# Patient Record
Sex: Female | Born: 1995 | Race: White | Hispanic: No | Marital: Single | Smoking: Current every day smoker
Health system: Southern US, Community
[De-identification: ages and names within clinical notes are randomized; demographics above are authoritative.]

## PROBLEM LIST (undated history)

## (undated) DIAGNOSIS — R6250 Unspecified lack of expected normal physiological development in childhood: Secondary | ICD-10-CM

## (undated) DIAGNOSIS — F941 Reactive attachment disorder of childhood: Secondary | ICD-10-CM

## (undated) DIAGNOSIS — F988 Other specified behavioral and emotional disorders with onset usually occurring in childhood and adolescence: Secondary | ICD-10-CM

## (undated) DIAGNOSIS — E063 Autoimmune thyroiditis: Secondary | ICD-10-CM

## (undated) DIAGNOSIS — F32A Depression, unspecified: Secondary | ICD-10-CM

## (undated) DIAGNOSIS — F913 Oppositional defiant disorder: Secondary | ICD-10-CM

## (undated) DIAGNOSIS — E669 Obesity, unspecified: Secondary | ICD-10-CM

## (undated) DIAGNOSIS — R51 Headache: Secondary | ICD-10-CM

## (undated) DIAGNOSIS — F4310 Post-traumatic stress disorder, unspecified: Secondary | ICD-10-CM

## (undated) DIAGNOSIS — E3431 Constitutional short stature: Secondary | ICD-10-CM

## (undated) DIAGNOSIS — F419 Anxiety disorder, unspecified: Secondary | ICD-10-CM

## (undated) DIAGNOSIS — E049 Nontoxic goiter, unspecified: Secondary | ICD-10-CM

## (undated) DIAGNOSIS — T7840XA Allergy, unspecified, initial encounter: Secondary | ICD-10-CM

## (undated) DIAGNOSIS — F329 Major depressive disorder, single episode, unspecified: Secondary | ICD-10-CM

## (undated) HISTORY — DX: Constitutional short stature: E34.31

## (undated) HISTORY — PX: TONSILLECTOMY: SUR1361

## (undated) HISTORY — DX: Obesity, unspecified: E66.9

## (undated) HISTORY — DX: Unspecified lack of expected normal physiological development in childhood: R62.50

## (undated) HISTORY — PX: TYMPANOSTOMY TUBE PLACEMENT: SHX32

## (undated) HISTORY — DX: Autoimmune thyroiditis: E06.3

## (undated) HISTORY — DX: Nontoxic goiter, unspecified: E04.9

## (undated) HISTORY — DX: Other specified behavioral and emotional disorders with onset usually occurring in childhood and adolescence: F98.8

---
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
## 2014-02-15 ENCOUNTER — Ambulatory Visit: Payer: Self-pay | Admitting: Pediatric Endocrinology

## 2014-07-06 ENCOUNTER — Ambulatory Visit (INDEPENDENT_AMBULATORY_CARE_PROVIDER_SITE_OTHER): Payer: BC Managed Care – PPO | Admitting: Family Medicine

## 2014-07-06 VITALS — BP 144/90 | HR 127 | Temp 98.1°F | Resp 18 | Ht 64.5 in | Wt 191.0 lb

## 2014-07-06 DIAGNOSIS — T23232A Burn of second degree of multiple left fingers (nail), not including thumb, initial encounter: Secondary | ICD-10-CM

## 2014-07-06 DIAGNOSIS — M79641 Pain in right hand: Secondary | ICD-10-CM

## 2014-07-06 DIAGNOSIS — M79642 Pain in left hand: Secondary | ICD-10-CM

## 2014-07-06 DIAGNOSIS — T310 Burns involving less than 10% of body surface: Secondary | ICD-10-CM

## 2014-07-06 MED ORDER — IBUPROFEN 200 MG PO TABS
200.0000 mg | ORAL_TABLET | Freq: Once | ORAL | Status: AC
  Administered 2014-07-06: 400 mg via ORAL

## 2014-07-06 MED ORDER — SILVER SULFADIAZINE 1 % EX CREA: 1.0000 "application " | TOPICAL_CREAM | Freq: Every day | CUTANEOUS | Status: DC

## 2014-07-06 NOTE — Patient Instructions (Signed)
Please follow up with [place] in the clinic Monday afternoon 07/11/14 so we can monitor your burns. Feel free to come back to the clinic earlier if needed. For pain you can take ibuprofen 200-400mg  every 6 hours as needed, and tylenol up to 1000mg  every 4-6 hours for pain as needed.  If you feel you need a different medication for your pain, please call the clinic.  Wash daily with soap and water.  Dressing changes daily with silvadene cream.    Second-Degree Burn A second-degree burn affects the 2 outer layers of skin. The outer layer (epidermis) and the layer underneath it (dermis) are both burned. Another name for this type of burn is a partial thickness burn. A second-degree burn may be called minor or major. This depends on the size of the burn. It also depends on what parts of the skin are burned. Minor burns may be treated with first aid. Major burns are a medical emergency. A second-degree burn is worse than a first-degree burn, but not as bad as a third-degree burn. A first-degree burn affects only the epidermis. A third-degree burn goes through all the layers of skin. A second-degree burn usually heals in 3 to 4 weeks. A minor second-degree burn usually does not leave a scar.Deeper second-degree burns may lead to scarring of the skin or contractures over joints.Contractures are scars that form over joints and may lead to reduced mobility at those joints. CAUSES  Heat (thermal) injury. This happens when skin comes in contact with something very hot. It could be a flame, a hot object, hot liquid, or steam. Most second-degree burns are thermal injuries.  Radiation. Sunlight is one type of radiation that can burn the skin. Another type of radiation is used to heat food. Radiation is also used to treat some diseases, such as cancer. All types of radiation can burn the skin. Sunlight usually causes a first-degree burn. Radiation used for heating food or treating a disease can cause a second-degree  burn.  Electricity. Electrical burns can cause more damage under the skin than on the surface. They should always be treated as major burns.  Chemicals. Many chemicals can burn the skin. The burn should be flushed with cool water and checked by an emergency caregiver. SYMPTOMS Symptoms of second-degree burns include:  Severe pain.  Extreme tenderness.  Deep redness.  Blistered skin.  Skin that has changed color.It might look blotchy, wet, or shiny.  Swelling. TREATMENT Some second-degree burns may need to be treated in a hospital. These include major burns, electrical burns, and chemical burns. Many other second-degree burns can be treated with regular first aid, such as:  Cooling the burn. Use cool, germ-free (sterile) salt water. Place the burned area of skin into a tub of water, or cover the burned area with clean, wet towels.  Taking pain medicine.  Removing the dead skin from broken blisters. A trained caregiver may do this. Do not pop blisters.  Gently washing your skin with mild soap.  Covering the burned area with a cream.Silver sulfadiazine is a cream for burns. An antibiotic cream, such as bacitracin, may also be used to fight infection. Do not use other ointments or creams unless your caregiver says it is okay.  Protecting the burn with a sterile, non-sticky bandage.  Bandaging fingers and toes separately. This keeps them from sticking together.  Taking an antibiotic. This can help prevent infection.  Getting a tetanus shot. HOME CARE INSTRUCTIONS Medication  Take any medicine prescribed by your  caregiver. Follow the directions carefully.  Ask your caregiver if you can take over-the-counter medicine to relieve pain and swelling. Do not give aspirin to children.  Make sure your caregiver knows about all other medicines you take.This includes over-the-counter medicines. Burn care  You will need to change the bandage on your burn. You may need to do this 2  or 3 times each day.  Gently clean the burned area.  Put ointment on it.  Cover the burn with a sterile bandage.  For some deeper burns or burns that cover a large area, compression garments may be prescribed. These garments can help minimize scarring and protect your mobility.  Do not put butter or oil on your skin. Use only the cream prescribed by your caregiver.  Do not put ice on your burn.  Do not break blisters on your skin.  Keep the bandaged area dry. You might need to take a sponge bath for awhile.Ask your caregiver when you can take a shower or a tub bath again.  Do not scratch an itchy burn. Your caregiver may give you medicine to relieve very bad itching.  Infection is a big danger after a second-degree burn. Tell your caregiver right away if you have signs of infection, such as:  Redness or changing color in the burned area.  Fluid leaking from the burn.  Swelling in the burn area.  A bad smell coming from the wound. Follow-up  Keep all follow-up appointments.This is important. This is how your caregiver can tell if your treatment is working.  Protect your burn from sunlight.Use sunscreen whenever you go outside.Burned areas may be sensitive to the sun for up to 1 year. Exposure to the sun may also cause permanent darkening of scars. SEEK MEDICAL CARE IF:  You have any questions about medicines.  You have any questions about your treatment.  You wonder if it is okay to do a particular activity.  You develop a fever of more than 100.5 F (38.1 C). SEEK IMMEDIATE MEDICAL CARE IF:  You think your burn might be infected. It may change color, become red, leak fluid, swell, or smell bad.  You develop a fever of more than 102 F (38.9 C). Document Released: 02/18/2011 Document Revised: 12/09/2011 Document Reviewed: 02/18/2011 [facility] Patient Information 2015 [place], [place]. This information is not intended to replace advice given to you by your health  care provider. Make sure you discuss any questions you have with your health care provider.
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
## 2014-07-08 NOTE — Progress Notes (Signed)
Patient discussed, interviewed, examined with Mr. Rosanne Sack. Agree with assessment and plan of care per his note.

## 2014-07-11 ENCOUNTER — Ambulatory Visit (INDEPENDENT_AMBULATORY_CARE_PROVIDER_SITE_OTHER): Payer: BC Managed Care – PPO | Admitting: Family Medicine

## 2014-07-11 VITALS — BP 118/72 | HR 84 | Temp 99.4°F | Resp 16 | Ht 64.5 in | Wt 191.0 lb

## 2014-07-11 DIAGNOSIS — T3110 Burns involving 10-19% of body surface with 0% to 9% third degree burns: Secondary | ICD-10-CM

## 2014-07-11 DIAGNOSIS — R05 Cough: Secondary | ICD-10-CM

## 2014-07-11 DIAGNOSIS — T3111 Burns involving 10-19% of body surface with 10-19% third degree burns: Secondary | ICD-10-CM

## 2014-07-11 DIAGNOSIS — R059 Cough, unspecified: Secondary | ICD-10-CM

## 2014-07-11 DIAGNOSIS — J069 Acute upper respiratory infection, unspecified: Secondary | ICD-10-CM

## 2014-07-11 MED ORDER — BENZONATATE 100 MG PO CAPS: 200.0000 mg | ORAL_CAPSULE | Freq: Two times a day (BID) | ORAL | Status: DC | PRN

## 2014-07-11 MED ORDER — AMOXICILLIN 875 MG PO TABS: 875.0000 mg | ORAL_TABLET | Freq: Two times a day (BID) | ORAL | Status: DC
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
## 2014-07-11 NOTE — Patient Instructions (Signed)
Cough, Adult ° A cough is a reflex that helps clear your throat and airways. It can help heal the body or may be a reaction to an irritated airway. A cough may only last 2 or 3 weeks (acute) or may last more than 8 weeks (chronic).  °CAUSES °Acute cough: °· Viral or bacterial infections. °Chronic cough: °· Infections. °· Allergies. °· Asthma. °· Post-nasal drip. °· Smoking. °· Heartburn or acid reflux. °· Some medicines. °· Chronic lung problems (COPD). °· Cancer. °SYMPTOMS  °· Cough. °· Fever. °· Chest pain. °· Increased breathing rate. °· High-pitched whistling sound when breathing (wheezing). °· Colored mucus that you cough up (sputum). °TREATMENT  °· A bacterial cough may be treated with antibiotic medicine. °· A viral cough must run its course and will not respond to antibiotics. °· Your caregiver may recommend other treatments if you have a chronic cough. °HOME CARE INSTRUCTIONS  °· Only take over-the-counter or prescription medicines for pain, discomfort, or fever as directed by your caregiver. Use cough suppressants only as directed by your caregiver. °· Use a cold steam vaporizer or humidifier in your bedroom or home to help loosen secretions. °· Sleep in a semi-upright position if your cough is worse at night. °· Rest as needed. °· Stop smoking if you smoke. °SEEK IMMEDIATE MEDICAL CARE IF:  °· You have pus in your sputum. °· Your cough starts to worsen. °· You cannot control your cough with suppressants and are losing sleep. °· You begin coughing up blood. °· You have difficulty breathing. °· You develop pain which is getting worse or is uncontrolled with medicine. °· You have a fever. °MAKE SURE YOU:  °· Understand these instructions. °· Will watch your condition. °· Will get help right away if you are not doing well or get worse. °Document Released: 03/15/2011 Document Revised: 12/09/2011 Document Reviewed: 03/15/2011 °ExitCare® Patient Information ©2015 [employer]. This information is not intended  to replace advice given to you by your health care provider. Make sure you discuss any questions you have with your health care provider. ° °Upper Respiratory Infection, Adult °An upper respiratory infection (URI) is also known as the common cold. It is often caused by a type of germ (virus). Colds are easily spread (contagious). You can pass it to others by kissing, coughing, sneezing, or drinking out of the same glass. Usually, you get better in 1 or 2 weeks.  °HOME CARE  °· Only take medicine as told by your doctor. °· Use a warm mist humidifier or breathe in steam from a hot shower. °· Drink enough water and fluids to keep your pee (urine) clear or pale yellow. °· Get plenty of rest. °· Return to work when your temperature is back to normal or as told by your doctor. You may use a face mask and wash your hands to stop your cold from spreading. °GET HELP RIGHT AWAY IF:  °· After the first few days, you feel you are getting worse. °· You have questions about your medicine. °· You have chills, shortness of breath, or brown or red spit (mucus). °· You have yellow or brown snot (nasal discharge) or pain in the face, especially when you bend forward. °· You have a fever, puffy (swollen) neck, pain when you swallow, or white spots in the back of your throat. °· You have a bad headache, ear pain, sinus pain, or chest pain. °· You have a high-pitched whistling sound when you breathe in and out (wheezing). °· You   have a lasting cough or cough up blood. °· You have sore muscles or a stiff neck. °MAKE SURE YOU:  °· Understand these instructions. °· Will watch your condition. °· Will get help right away if you are not doing well or get worse. °Document Released: 03/04/2008 Document Revised: 12/09/2011 Document Reviewed: 12/22/2013 °ExitCare® Patient Information ©2015 [employer]. This information is not intended to replace advice given to you by your health care provider. Make sure you discuss any questions you have with  your health care provider. ° °

## 2014-08-12 ENCOUNTER — Ambulatory Visit (INDEPENDENT_AMBULATORY_CARE_PROVIDER_SITE_OTHER): Payer: BC Managed Care – PPO | Admitting: Emergency Medicine

## 2014-08-12 ENCOUNTER — Ambulatory Visit (INDEPENDENT_AMBULATORY_CARE_PROVIDER_SITE_OTHER): Payer: BC Managed Care – PPO

## 2014-08-12 VITALS — BP 124/78 | HR 72 | Temp 98.1°F | Resp 16 | Ht 63.0 in | Wt 185.0 lb

## 2014-08-12 DIAGNOSIS — M2392 Unspecified internal derangement of left knee: Secondary | ICD-10-CM

## 2014-08-12 MED ORDER — NAPROXEN SODIUM 550 MG PO TABS: 550.0000 mg | ORAL_TABLET | Freq: Two times a day (BID) | ORAL | Status: DC

## 2014-08-12 MED ORDER — ACETAMINOPHEN-CODEINE #3 300-30 MG PO TABS: 1.0000 | ORAL_TABLET | ORAL | Status: DC | PRN
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
## 2014-08-12 NOTE — Patient Instructions (Signed)

## 2014-08-15 ENCOUNTER — Inpatient Hospital Stay (HOSPITAL_COMMUNITY)
Admission: EM | Admit: 2014-08-15 | Discharge: 2014-08-17 | DRG: 918 | Disposition: A | Discharge status: Other Acute Inpatient Hospital | Payer: BC Managed Care – PPO | Attending: Internal Medicine | Admitting: Internal Medicine

## 2014-08-15 ENCOUNTER — Encounter (HOSPITAL_COMMUNITY): Payer: Self-pay | Admitting: Emergency Medicine

## 2014-08-15 DIAGNOSIS — T39012A Poisoning by aspirin, intentional self-harm, initial encounter: Secondary | ICD-10-CM | POA: Diagnosis not present

## 2014-08-15 DIAGNOSIS — F331 Major depressive disorder, recurrent, moderate: Secondary | ICD-10-CM | POA: Diagnosis present

## 2014-08-15 DIAGNOSIS — F988 Other specified behavioral and emotional disorders with onset usually occurring in childhood and adolescence: Secondary | ICD-10-CM | POA: Diagnosis present

## 2014-08-15 DIAGNOSIS — T39092A Poisoning by salicylates, intentional self-harm, initial encounter: Secondary | ICD-10-CM | POA: Diagnosis not present

## 2014-08-15 DIAGNOSIS — F913 Oppositional defiant disorder: Secondary | ICD-10-CM | POA: Diagnosis present

## 2014-08-15 DIAGNOSIS — F4310 Post-traumatic stress disorder, unspecified: Secondary | ICD-10-CM | POA: Diagnosis present

## 2014-08-15 DIAGNOSIS — E063 Autoimmune thyroiditis: Secondary | ICD-10-CM | POA: Diagnosis present

## 2014-08-15 DIAGNOSIS — R45851 Suicidal ideations: Secondary | ICD-10-CM | POA: Insufficient documentation

## 2014-08-15 DIAGNOSIS — Z79899 Other long term (current) drug therapy: Secondary | ICD-10-CM

## 2014-08-15 DIAGNOSIS — E669 Obesity, unspecified: Secondary | ICD-10-CM | POA: Diagnosis present

## 2014-08-15 DIAGNOSIS — F419 Anxiety disorder, unspecified: Secondary | ICD-10-CM | POA: Diagnosis present

## 2014-08-15 DIAGNOSIS — F17210 Nicotine dependence, cigarettes, uncomplicated: Secondary | ICD-10-CM | POA: Diagnosis present

## 2014-08-15 DIAGNOSIS — F339 Major depressive disorder, recurrent, unspecified: Secondary | ICD-10-CM | POA: Diagnosis present

## 2014-08-15 DIAGNOSIS — T39091A Poisoning by salicylates, accidental (unintentional), initial encounter: Secondary | ICD-10-CM | POA: Diagnosis present

## 2014-08-15 LAB — RAPID URINE DRUG SCREEN, HOSP PERFORMED
AMPHETAMINES: NOT DETECTED
BARBITURATES: NOT DETECTED
BENZODIAZEPINES: NOT DETECTED
COCAINE: NOT DETECTED
Opiates: POSITIVE — AB
TETRAHYDROCANNABINOL: NOT DETECTED

## 2014-08-15 LAB — POC URINE PREG, ED: PREG TEST UR: NEGATIVE

## 2014-08-15 NOTE — ED Notes (Signed)
Pts family states about a half an hour ago she took about 1/4 bottle of aspirin  Pt states she made herself throw up afterward and she has done so since  Pt is tearful in triage  Pt denies pain at this time  Pt denies this as an attempt to kill herself

## 2014-08-15 NOTE — ED Notes (Addendum)
Received salicylate overdose protocol-given to Dr. Midge Aver fluid bolus is 20cc/kg NS for bolus

## 2014-08-15 NOTE — ED Notes (Addendum)
Spoke with [name] from [facility] clearance labs -EKG -activated charcoal if tolerated -hydrate with normal saline bolus 65ml/kg -maintain urine output 1-2 ml/kg/hr -obtain INR

## 2014-08-15 NOTE — ED Notes (Signed)
MD at bedside. 

## 2014-08-16 ENCOUNTER — Encounter (HOSPITAL_COMMUNITY): Payer: Self-pay | Admitting: Internal Medicine

## 2014-08-16 DIAGNOSIS — F339 Major depressive disorder, recurrent, unspecified: Secondary | ICD-10-CM | POA: Diagnosis present

## 2014-08-16 DIAGNOSIS — Z9119 Patient's noncompliance with other medical treatment and regimen: Secondary | ICD-10-CM

## 2014-08-16 DIAGNOSIS — Z79899 Other long term (current) drug therapy: Secondary | ICD-10-CM | POA: Diagnosis not present

## 2014-08-16 DIAGNOSIS — T39092A Poisoning by salicylates, intentional self-harm, initial encounter: Secondary | ICD-10-CM | POA: Diagnosis present

## 2014-08-16 DIAGNOSIS — F4310 Post-traumatic stress disorder, unspecified: Secondary | ICD-10-CM | POA: Diagnosis present

## 2014-08-16 DIAGNOSIS — F913 Oppositional defiant disorder: Secondary | ICD-10-CM | POA: Diagnosis present

## 2014-08-16 DIAGNOSIS — T39012A Poisoning by aspirin, intentional self-harm, initial encounter: Secondary | ICD-10-CM | POA: Diagnosis present

## 2014-08-16 DIAGNOSIS — R45851 Suicidal ideations: Secondary | ICD-10-CM

## 2014-08-16 DIAGNOSIS — F17210 Nicotine dependence, cigarettes, uncomplicated: Secondary | ICD-10-CM | POA: Diagnosis present

## 2014-08-16 DIAGNOSIS — T39092S Poisoning by salicylates, intentional self-harm, sequela: Secondary | ICD-10-CM

## 2014-08-16 DIAGNOSIS — F331 Major depressive disorder, recurrent, moderate: Secondary | ICD-10-CM

## 2014-08-16 DIAGNOSIS — E063 Autoimmune thyroiditis: Secondary | ICD-10-CM

## 2014-08-16 DIAGNOSIS — F3130 Bipolar disorder, current episode depressed, mild or moderate severity, unspecified: Secondary | ICD-10-CM

## 2014-08-16 DIAGNOSIS — T39091A Poisoning by salicylates, accidental (unintentional), initial encounter: Secondary | ICD-10-CM | POA: Diagnosis present

## 2014-08-16 DIAGNOSIS — F988 Other specified behavioral and emotional disorders with onset usually occurring in childhood and adolescence: Secondary | ICD-10-CM | POA: Diagnosis present

## 2014-08-16 DIAGNOSIS — F419 Anxiety disorder, unspecified: Secondary | ICD-10-CM | POA: Diagnosis present

## 2014-08-16 DIAGNOSIS — E669 Obesity, unspecified: Secondary | ICD-10-CM | POA: Diagnosis present

## 2014-08-16 DIAGNOSIS — F1910 Other psychoactive substance abuse, uncomplicated: Secondary | ICD-10-CM

## 2014-08-16 DIAGNOSIS — F902 Attention-deficit hyperactivity disorder, combined type: Secondary | ICD-10-CM

## 2014-08-16 LAB — BLOOD GAS, ARTERIAL
ACID-BASE DEFICIT: 0.5 mmol/L (ref 0.0–2.0)
BICARBONATE: 23.6 meq/L (ref 20.0–24.0)
Drawn by: 11249
FIO2: 0.21 %
O2 Saturation: 99.1 %
PCO2 ART: 38.4 mmHg (ref 35.0–45.0)
PH ART: 7.403 (ref 7.350–7.450)
PO2 ART: 130 mmHg — AB (ref 80.0–100.0)
Patient temperature: 98.1
TCO2: 20.9 mmol/L (ref 0–100)

## 2014-08-16 LAB — CBC WITH DIFFERENTIAL/PLATELET
BASOS ABS: 0 10*3/uL (ref 0.0–0.1)
Basophils Relative: 0 % (ref 0–1)
EOS PCT: 1 % (ref 0–5)
Eosinophils Absolute: 0.2 10*3/uL (ref 0.0–0.7)
HEMATOCRIT: 41.1 % (ref 36.0–46.0)
Hemoglobin: 13.2 g/dL (ref 12.0–15.0)
LYMPHS PCT: 20 % (ref 12–46)
Lymphs Abs: 3.1 10*3/uL (ref 0.7–4.0)
MCH: 25 pg — AB (ref 26.0–34.0)
MCHC: 32.1 g/dL (ref 30.0–36.0)
MCV: 77.7 fL — AB (ref 78.0–100.0)
MONO ABS: 0.4 10*3/uL (ref 0.1–1.0)
Monocytes Relative: 3 % (ref 3–12)
Neutro Abs: 11.9 10*3/uL — ABNORMAL HIGH (ref 1.7–7.7)
Neutrophils Relative %: 76 % (ref 43–77)
Platelets: 463 10*3/uL — ABNORMAL HIGH (ref 150–400)
RBC: 5.29 MIL/uL — ABNORMAL HIGH (ref 3.87–5.11)
RDW: 14.9 % (ref 11.5–15.5)
WBC: 15.6 10*3/uL — ABNORMAL HIGH (ref 4.0–10.5)

## 2014-08-16 LAB — COMPREHENSIVE METABOLIC PANEL
ALBUMIN: 3.6 g/dL (ref 3.5–5.2)
ALK PHOS: 126 U/L — AB (ref 39–117)
ALT: 44 U/L — AB (ref 0–35)
ALT: 41 U/L — AB (ref 0–35)
AST: 40 U/L — AB (ref 0–37)
AST: 42 U/L — AB (ref 0–37)
Albumin: 3.5 g/dL (ref 3.5–5.2)
Alkaline Phosphatase: 118 U/L — ABNORMAL HIGH (ref 39–117)
Anion gap: 14 (ref 5–15)
Anion gap: 16 — ABNORMAL HIGH (ref 5–15)
BUN: 15 mg/dL (ref 6–23)
BUN: 8 mg/dL (ref 6–23)
CALCIUM: 9.4 mg/dL (ref 8.4–10.5)
CHLORIDE: 106 meq/L (ref 96–112)
CO2: 21 mEq/L (ref 19–32)
CO2: 23 meq/L (ref 19–32)
CREATININE: 0.71 mg/dL (ref 0.50–1.10)
Calcium: 9.4 mg/dL (ref 8.4–10.5)
Chloride: 103 mEq/L (ref 96–112)
Creatinine, Ser: 0.77 mg/dL (ref 0.50–1.10)
GFR calc Af Amer: >90 mL/min (ref >90)
GFR calc non Af Amer: >90 mL/min (ref >90)
Glucose, Bld: 110 mg/dL — ABNORMAL HIGH (ref 70–99)
Glucose, Bld: 89 mg/dL (ref 70–99)
POTASSIUM: 3.9 meq/L (ref 3.7–5.3)
Potassium: 4.2 mEq/L (ref 3.7–5.3)
SODIUM: 141 meq/L (ref 137–147)
SODIUM: 142 meq/L (ref 137–147)
Total Protein: 7.2 g/dL (ref 6.0–8.3)
Total Protein: 7.3 g/dL (ref 6.0–8.3)

## 2014-08-16 LAB — CBC
HCT: 41.6 % (ref 36.0–46.0)
HEMOGLOBIN: 13.2 g/dL (ref 12.0–15.0)
MCH: 24.9 pg — AB (ref 26.0–34.0)
MCHC: 31.7 g/dL (ref 30.0–36.0)
MCV: 78.3 fL (ref 78.0–100.0)
PLATELETS: 404 10*3/uL — AB (ref 150–400)
RBC: 5.31 MIL/uL — AB (ref 3.87–5.11)
RDW: 14.8 % (ref 11.5–15.5)
WBC: 18.1 10*3/uL — ABNORMAL HIGH (ref 4.0–10.5)

## 2014-08-16 LAB — TSH: TSH: 0.932 u[IU]/mL (ref 0.350–4.500)

## 2014-08-16 LAB — MRSA PCR SCREENING: MRSA by PCR: NEGATIVE

## 2014-08-16 LAB — HEPATITIS PANEL, ACUTE
HCV Ab: NEGATIVE
HEP B C IGM: NONREACTIVE
Hep A IgM: NONREACTIVE
Hepatitis B Surface Ag: NEGATIVE

## 2014-08-16 LAB — ACETAMINOPHEN LEVEL: Acetaminophen (Tylenol), Serum: <15 ug/mL (ref 10–30)

## 2014-08-16 LAB — MAGNESIUM: Magnesium: 2 mg/dL (ref 1.5–2.5)

## 2014-08-16 LAB — PROTIME-INR
INR: 1 (ref 0.00–1.49)
Prothrombin Time: 13.3 seconds (ref 11.6–15.2)

## 2014-08-16 LAB — LITHIUM LEVEL: Lithium Lvl: <0.25 mEq/L — ABNORMAL LOW (ref 0.80–1.40)

## 2014-08-16 LAB — SALICYLATE LEVEL
SALICYLATE LVL: 12.4 mg/dL (ref 2.8–20.0)
Salicylate Lvl: 13 mg/dL (ref 2.8–20.0)
Salicylate Lvl: 17 mg/dL (ref 2.8–20.0)
Salicylate Lvl: 5.7 mg/dL (ref 2.8–20.0)

## 2014-08-16 LAB — ETHANOL: Alcohol, Ethyl (B): <11 mg/dL (ref 0–11)

## 2014-08-16 MED ORDER — SODIUM CHLORIDE 0.9 % IV BOLUS (SEPSIS)
2000.0000 mL | Freq: Once | INTRAVENOUS | Status: AC
  Administered 2014-08-16: 2000 mL via INTRAVENOUS

## 2014-08-16 MED ORDER — LISDEXAMFETAMINE DIMESYLATE 20 MG PO CAPS
40.0000 mg | ORAL_CAPSULE | Freq: Every day | ORAL | Status: DC
  Administered 2014-08-16 – 2014-08-17 (×2): 40 mg via ORAL
  Filled 2014-08-16 (×3): qty 2

## 2014-08-16 MED ORDER — ACETAMINOPHEN 325 MG PO TABS
650.0000 mg | ORAL_TABLET | Freq: Four times a day (QID) | ORAL | Status: DC | PRN
  Administered 2014-08-16 – 2014-08-17 (×2): 650 mg via ORAL
  Filled 2014-08-16 (×2): qty 2

## 2014-08-16 MED ORDER — LURASIDONE HCL 80 MG PO TABS
80.0000 mg | ORAL_TABLET | Freq: Every day | ORAL | Status: DC
  Administered 2014-08-16: 80 mg via ORAL
  Filled 2014-08-16 (×2): qty 1

## 2014-08-16 MED ORDER — HYDROXYZINE PAMOATE 25 MG PO CAPS
25.0000 mg | ORAL_CAPSULE | Freq: Two times a day (BID) | ORAL | Status: DC
  Filled 2014-08-16 (×2): qty 1

## 2014-08-16 MED ORDER — ONDANSETRON HCL 4 MG PO TABS: 4.0000 mg | ORAL_TABLET | Freq: Four times a day (QID) | ORAL | Status: DC | PRN

## 2014-08-16 MED ORDER — LAMOTRIGINE 100 MG PO TABS
100.0000 mg | ORAL_TABLET | Freq: Every day | ORAL | Status: DC
  Administered 2014-08-16 – 2014-08-17 (×2): 100 mg via ORAL
  Filled 2014-08-16 (×2): qty 1

## 2014-08-16 MED ORDER — SODIUM CHLORIDE 0.9 % IV SOLN
INTRAVENOUS | Status: AC
  Administered 2014-08-16 (×3): via INTRAVENOUS

## 2014-08-16 MED ORDER — ONDANSETRON HCL 4 MG/2ML IJ SOLN
4.0000 mg | Freq: Four times a day (QID) | INTRAMUSCULAR | Status: DC | PRN
  Administered 2014-08-16: 4 mg via INTRAVENOUS
  Filled 2014-08-16: qty 2

## 2014-08-16 MED ORDER — HYDROXYZINE HCL 25 MG PO TABS
25.0000 mg | ORAL_TABLET | Freq: Two times a day (BID) | ORAL | Status: DC
  Administered 2014-08-16 – 2014-08-17 (×3): 25 mg via ORAL
  Filled 2014-08-16: qty 1

## 2014-08-16 MED ORDER — ESCITALOPRAM OXALATE 20 MG PO TABS
20.0000 mg | ORAL_TABLET | Freq: Every day | ORAL | Status: DC
  Administered 2014-08-16 – 2014-08-17 (×2): 20 mg via ORAL
  Filled 2014-08-16 (×2): qty 1

## 2014-08-16 MED ORDER — CHARCOAL ACTIVATED PO LIQD
50.0000 g | Freq: Once | ORAL | Status: AC
  Administered 2014-08-16: 50 g via ORAL
  Filled 2014-08-16: qty 240

## 2014-08-16 MED ORDER — CLONIDINE HCL ER 0.1 MG PO TB12
0.1000 mg | ORAL_TABLET | Freq: Two times a day (BID) | ORAL | Status: DC
  Administered 2014-08-16 – 2014-08-17 (×3): 0.1 mg via ORAL
  Filled 2014-08-16 (×4): qty 1

## 2014-08-16 MED ORDER — ENOXAPARIN SODIUM 40 MG/0.4ML ~~LOC~~ SOLN
40.0000 mg | Freq: Every day | SUBCUTANEOUS | Status: DC
  Administered 2014-08-16 – 2014-08-17 (×2): 40 mg via SUBCUTANEOUS
  Filled 2014-08-16 (×2): qty 0.4

## 2014-08-16 MED ORDER — METHYLPHENIDATE HCL ER (OSM) 18 MG PO TBCR
18.0000 mg | EXTENDED_RELEASE_TABLET | Freq: Every day | ORAL | Status: DC
  Administered 2014-08-16 – 2014-08-17 (×2): 18 mg via ORAL
  Filled 2014-08-16 (×4): qty 1

## 2014-08-16 MED ORDER — NON FORMULARY: 1.0000 | Freq: Every morning | Status: DC

## 2014-08-16 MED ORDER — ACETAMINOPHEN 650 MG RE SUPP: 650.0000 mg | Freq: Four times a day (QID) | RECTAL | Status: DC | PRN

## 2014-08-16 MED ORDER — LITHIUM CARBONATE 300 MG PO CAPS
300.0000 mg | ORAL_CAPSULE | Freq: Two times a day (BID) | ORAL | Status: DC
Start: 2014-08-16 — End: 2014-08-17
  Administered 2014-08-16 – 2014-08-17 (×4): 300 mg via ORAL
  Filled 2014-08-16 (×6): qty 1

## 2014-08-16 MED ORDER — LEVOTHYROXINE SODIUM 75 MCG PO TABS
150.0000 ug | ORAL_TABLET | Freq: Every day | ORAL | Status: DC
  Administered 2014-08-16 – 2014-08-17 (×2): 150 ug via ORAL
  Filled 2014-08-16 (×2): qty 2

## 2014-08-16 MED ORDER — SODIUM CHLORIDE 0.9 % IV BOLUS (SEPSIS)
1000.0000 mL | Freq: Once | INTRAVENOUS | Status: AC
  Administered 2014-08-16: 1000 mL via INTRAVENOUS

## 2014-08-16 MED ORDER — LEVONORGESTREL-ETHINYL ESTRAD 0.1-20 MG-MCG PO TABS
1.0000 | ORAL_TABLET | Freq: Every day | ORAL | Status: DC
  Administered 2014-08-16 – 2014-08-17 (×2): 1 via ORAL

## 2014-08-16 NOTE — Progress Notes (Signed)
UR completed 

## 2014-08-16 NOTE — Progress Notes (Signed)
Patient ID: [name], female   DOB: March 11, 1996, 18 y.o.   MRN: [id number] [facility] PROGRESS NOTE  Hadja Harral WUJ:[id number] DOB: 1996-04-23 DOA: 08/15/2014 PCP: Maurice Small, D, MD  Brief narrative: Addendum to admission note done 08/16/2014  18 y.o. female with history of depression, ADD and hypothyroidism who presented to [facility] ED 95/62/1308 for salicylate overdose. Patient apparently swallowed 20 pills of 325 mg aspirin tablets 1 day PTA and reported to her parents. Her parents induced vomiting and brought her to the ER. Patient denied suicidal ideation on this admission but has had suicidal attempt in past. In ED, patient was hemodynamically stable. Salicylate level was 5.7 on admission, then 12.4 and 17. Poison control was contacted and advised giving normal saline bolus and charcoal. The charcoal was given twice. Patient is admitted to stepdown unit for further management of salicylate overdose.  Assessment/Plan:    Principal Problem:   Salicylate overdose - salicylate level 5.7 --> 12.4 --> 17 - given charcoal twice - admission to SDU for further monitoring - repeat salicylate level  - psych evaluation - consult requested   DVT Prophylaxis  - Lovenox subQ ordered.   Code Status: Full.  Disposition Plan: remains in SDU  IV access:   Peripheral IV  Procedures and diagnostic studies:    No results found.  Medical Consultants:   Psychiatry  Other Consultants:   None  IAnti-Infectives:    None    Leisa Lenz, MD  [facility] Pager [phone]  If 7PM-7AM, please contact night-coverage [email or web address] Password [email or web address] 08/16/2014, 10:46 AM   LOS: 1 day    HPI/Subjective: No acute overnight events.  Objective: Filed Vitals:   08/16/14 0800 08/16/14 0830 08/16/14 0900 08/16/14 1000  BP: 133/75 126/66 134/90 157/93  Pulse: 83 90 95 95  Temp:   98 F (36.7 C)   TempSrc:   Oral   Resp: 22 15 20 24   Height:   5\' 3"  (1.6 m)   Weight:   89.7 kg  (197 lb 12 oz)   SpO2: 99% 98% 98% 97%    Intake/Output Summary (Last 24 hours) at 08/16/14 1046 Last data filed at 08/16/14 0901  Gross per 24 hour  Intake      0 ml  Output   4700 ml  Net  -4700 ml     Data Reviewed: Basic Metabolic Panel:  Recent Labs Lab 08/15/14 2335  NA 142  K 4.2  CL 103  CO2 23  GLUCOSE 89  BUN 15  CREATININE 0.77  CALCIUM 9.4  MG 2.0   Liver Function Tests:  Recent Labs Lab 08/15/14 2335  AST 42*  ALT 41*  ALKPHOS 118*  BILITOT <0.2*  PROT 7.2  ALBUMIN 3.5   No results for input(s): LIPASE, AMYLASE in the last 168 hours. No results for input(s): AMMONIA in the last 168 hours. CBC:  Recent Labs Lab 08/15/14 2335  WBC 18.1*  HGB 13.2  HCT 41.6  MCV 78.3  PLT 404*   Cardiac Enzymes: No results for input(s): CKTOTAL, CKMB, CKMBINDEX, TROPONINI in the last 168 hours. BNP: Invalid input(s): POCBNP CBG: No results for input(s): GLUCAP in the last 168 hours.  No results found for this or any previous visit (from the past 240 hour(s)).   Scheduled Meds: . cloNIDine HCl  0.1 mg Oral BID  . enoxaparin (LOVENOX) injection  40 mg Subcutaneous Daily  . escitalopram  20 mg Oral Daily  . hydrOXYzine  25 mg Oral BID  .  lamoTRIgine  100 mg Oral Daily  . levothyroxine  150 mcg Oral QAC breakfast  . lisdexamfetamine  40 mg Oral Daily  . lithium carbonate  300 mg Oral BID WC  . lurasidone  80 mg Oral QHS  . methylphenidate  18 mg Oral Daily   Continuous Infusions: . sodium chloride 150 mL/hr at 08/16/14 1040

## 2014-08-16 NOTE — ED Notes (Signed)
Pt has in belonging bag:  Black and white hoodie sweater, blue sweat pants, green shirt, brown winter boots at the nurses station in front of 16.

## 2014-08-16 NOTE — ED Notes (Signed)
Shanon Brow from [employer] called and update given on patient condition and plan of care to admit patient
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
## 2014-08-16 NOTE — ED Provider Notes (Signed)
CSN: [id number]     Arrival date & time 08/15/14  2246 History   First MD Initiated Contact with Patient 08/15/14 2312     Chief Complaint  Patient presents with  . Ingestion     (Consider location/radiation/quality/duration/timing/severity/associated sxs/prior Treatment) HPI Patient presents after intentional aspirin overdose 45 minutes prior to presentation. She states she took 15-2325 mg aspirin tablets this evening. She immediately alerted her family. She's been induced vomiting. No known tablet particles visualized. Mild nausea initially she is now resolved. She denies any other coingestants. She has a previous history of suicide attempt but states this was not a suicide attempt. She is unable to tell me the reason she took many aspirin tablets. She currently denies any headache, vision changes, hearing changes, nausea or vomiting, abdominal pain, shortness of breath. Past Medical History  Diagnosis Date  . ADD (attention deficit disorder)   . Goiter   . Hashimoto disease   . Constitutional growth delay   . Headache(784.0)   . Anxiety   . Obesity, unspecified 01/18/2013  . Depression   . Allergy   . ODD (oppositional defiant disorder)   . PTSD (post-traumatic stress disorder)   . Reactive attachment disorder    Past Surgical History  Procedure Laterality Date  . Tonsillectomy      age 76yo  . 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 Adopted: Yes   History  Substance Use Topics  . Smoking status: Current Every Day Smoker -- 0.25 packs/day for 2 years    Types: Cigarettes  . Smokeless tobacco: Never Used  . Alcohol Use: No   OB History    No data available     Review of Systems  Constitutional: Negative for fever and chills.  HENT: Negative for hearing loss.   Respiratory: Negative for cough and shortness of breath.   Cardiovascular: Negative for chest pain.  Gastrointestinal: Positive for nausea.  Negative for vomiting, abdominal pain and diarrhea.  Musculoskeletal: Negative for myalgias, back pain, neck pain and neck stiffness.  Skin: Negative for rash and wound.  Neurological: Negative for dizziness, syncope, weakness, light-headedness, numbness and headaches.  Psychiatric/Behavioral: Negative for suicidal ideas.  All other systems reviewed and are negative.     Allergies  Alprazolam  Home Medications   Prior to Admission medications   Medication Sig Start Date End Date Taking? Authorizing Provider  acetaminophen-codeine (TYLENOL #3) 300-30 MG per tablet Take 1-2 tablets by mouth every 4 (four) hours as needed. 08/12/14  Yes Roselee Culver, MD  cloNIDine HCl (KAPVAY) 0.1 MG TB12 ER tablet Take 0.1 mg by mouth 2 (two) times daily.   Yes Historical Provider, MD  escitalopram (LEXAPRO) 20 MG tablet Take 1 tablet (20 mg total) by mouth daily. 10/27/13  Yes Aurelio Jew, NP  hydrOXYzine (VISTARIL) 25 MG capsule Take 25 mg by mouth 2 (two) times daily. 08/08/14  Yes Historical Provider, MD  lamoTRIgine (LAMICTAL) 100 MG tablet Take 100 mg by mouth daily. 08/08/14  Yes Historical Provider, MD  levothyroxine (SYNTHROID, LEVOTHROID) 150 MCG tablet Take 150 mcg by mouth daily.  10/27/13  Yes Aurelio Jew, NP  lithium carbonate 300 MG capsule Take 1 capsule (300 mg total) by mouth 2 (two) times daily with a meal. 11/06/13  Yes Delfin Gant, NP  lurasidone (LATUDA) 80 MG TABS tablet Take 1 tablet (80 mg total) by mouth at bedtime. 10/27/13  Yes Aurelio Jew, NP  methylphenidate (CONCERTA) 18 MG CR tablet Take 18 mg by mouth daily.   Yes Historical Provider, MD  ORSYTHIA 0.1-20 MG-MCG tablet Take 1 tablet by mouth daily. 07/01/14  Yes Historical Provider, MD  VYVANSE 40 MG capsule Take 40 mg by mouth daily. 08/08/14  Yes Historical Provider, MD  naproxen sodium (ANAPROX DS) 550 MG tablet Take 1 tablet (550 mg total) by mouth 2 (two) times daily with a meal. 08/12/14 08/12/15  Roselee Culver, MD  norgestimate-ethinyl estradiol ([place] 28) 0.25-35 MG-MCG tablet 1 PO once daily. 12/11/13   Darlyne Russian, MD  silver sulfADIAZINE (SILVADENE) 1 % cream Apply 1 application topically daily. 07/06/14   Todd McVeigh, PA   BP 134/63 mmHg  Pulse 99  Temp(Src) 98.1 F (36.7 C) (Oral)  Resp 22  Ht 5\' 3"  (1.6 m)  Wt 190 lb (86.183 kg)  BMI 33.67 kg/m2  SpO2 96%  LMP 08/09/2014 Physical Exam  Constitutional: She is oriented to person, place, and time. She appears well-developed and well-nourished. No distress.  HENT:  Head: Normocephalic and atraumatic.  Mouth/Throat: Oropharynx is clear and moist.  Eyes: EOM are normal. Pupils are equal, round, and reactive to light.  No nystagmus  Neck: Normal range of motion. Neck supple.  Cardiovascular: Normal rate and regular rhythm.  Exam reveals no gallop and no friction rub.   No murmur heard. Pulmonary/Chest: Effort normal and breath sounds normal. No respiratory distress. She has no wheezes. She has no rales. She exhibits no tenderness.  Abdominal: Soft. Bowel sounds are normal. She exhibits no distension and no mass. There is no tenderness. There is no rebound and no guarding.  Musculoskeletal: Normal range of motion. She exhibits no edema or tenderness.  Neurological: She is alert and oriented to person, place, and time.  Patient is alert and oriented x3 with clear, goal oriented speech. Patient has 5/5 motor in all extremities. Sensation is intact to light touch. Bilateral finger-to-nose is normal with no signs of dysmetria.   Skin: Skin is warm and dry. No rash noted. No erythema.  Multiple well-healing scars to the left forearm  Psychiatric:  Patient is tearful but denying suicidal ideation.  Nursing note and vitals reviewed.   ED Course  Procedures (including critical care time) Labs Review Labs Reviewed  CBC - Abnormal; Notable for the following:    WBC 18.1 (*)    RBC 5.31 (*)    MCH 24.9 (*)    Platelets 404 (*)     All other components within normal limits  COMPREHENSIVE METABOLIC PANEL - Abnormal; Notable for the following:    AST 42 (*)    ALT 41 (*)    Alkaline Phosphatase 118 (*)    Total Bilirubin <0.2 (*)    Anion gap 16 (*)    All other components within normal limits  URINE RAPID DRUG SCREEN (HOSP PERFORMED) - Abnormal; Notable for the following:    Opiates POSITIVE (*)    All other components within normal limits  LITHIUM LEVEL - Abnormal; Notable for the following:    Lithium Lvl <0.25 (*)    All other components within normal limits  BLOOD GAS, ARTERIAL - Abnormal; Notable for the following:    pO2, Arterial 130.0 (*)    All other components within normal limits  ETHANOL  ACETAMINOPHEN LEVEL  SALICYLATE LEVEL  PROTIME-INR  MAGNESIUM  SALICYLATE LEVEL  POC URINE PREG, ED    Imaging Review No results  found.   EKG Interpretation None      Date: 08/16/2014  Rate: 97  Rhythm: normal sinus rhythm  QRS Axis: normal  Intervals: normal  ST/T Wave abnormalities: nonspecific T wave changes  Conduction Disutrbances:none  Narrative Interpretation:   Old EKG Reviewed: none available  CRITICAL CARE Performed by: Lita Mains, Hurschel Paynter Total critical care time: 20 min Critical care time was exclusive of separately billable procedures and treating other patients. Critical care was necessary to treat or prevent imminent or life-threatening deterioration. Critical care was time spent personally by me on the following activities: development of treatment plan with patient and/or surrogate as well as nursing, discussions with consultants, evaluation of patient's response to treatment, examination of patient, obtaining history from patient or surrogate, ordering and performing treatments and interventions, ordering and review of laboratory studies, ordering and review of radiographic studies, pulse oximetry and re-evaluation of patient's condition.  MDM   Final diagnoses:  Salicylate  overdose, intentional self-harm, initial encounter    Aren't spoke with poison control. Faxed over recommendations for dental salicylate management. The patient initially bolused with 2 L normal saline and given activated charcoal. We will begin urine alkalinization as dictated by protocol.  Patient remains asymptomatic. Initial salicylate level 5.7. Repeatwith increased level to 12.4. We'll re-dose activated charcoal per protocol. Discussed with [facility] hospitalist, who will admit to step down bed.  Julianne Rice, MD 08/16/14 [phone]

## 2014-08-16 NOTE — H&P (Signed)
[facility] History and Physical  [name] UXL:[id number] DOB: Apr 11, 1996 DOA: 08/15/2014  Referring physician: ER physician PCP: Jonathon Bellows, MD   History obtained from patient's father, patient and ER physician.  Chief Complaint: Salicylate overdose.  HPI: [name] is a 18 y.o. female with history of depression ADD and hypothyroidism took 20 pills of 325 mg aspirin tablets yesterday and reported to her parents. Her parents induced vomiting and brought her to the ER. Patient states that she developed accidentally and did not have any suicidal ideation. Patient has had previous episodes of suicidal attempts. In the ER initial salicylate levels were around 5 and repeat one was around 12. Poison control was contacted and had advised to give normal saline bolus and the charcoal. Charcoal was given twice. Patient has been admitted for further observation and management. Patient otherwise denies any tinnitus headache visual symptoms patient is well oriented and follows commands denies any chest pain shortness of breath nausea vomiting abdominal pain and diarrhea. Patient denies taking any other medications along with the aspirin in overdose.  Review of Systems: As presented in the history of presenting illness, rest negative.  Past Medical History  Diagnosis Date  . ADD (attention deficit disorder)   . Goiter   . Hashimoto disease   . Constitutional growth delay   . Headache(784.0)   . Anxiety   . Obesity, unspecified 01/18/2013  . Depression   . Allergy   . ODD (oppositional defiant disorder)   . PTSD (post-traumatic stress disorder)   . Reactive attachment disorder    Past Surgical History  Procedure Laterality Date  . Tonsillectomy      age 36yo  . 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  She has a .5 pack-year smoking history. She has never used smokeless tobacco. She reports that she does  not drink alcohol or use illicit drugs. Where does patient live home. Can patient participate in ADLs? Yes.  Allergies  Allergen Reactions  . Alprazolam Other (See Comments)    Makes er very angry and hostile    Family History:  Family History  Problem Relation Age of Onset  . Adopted: Yes      Prior to Admission medications   Medication Sig Start Date End Date Taking? Authorizing Provider  acetaminophen-codeine (TYLENOL #3) 300-30 MG per tablet Take 1-2 tablets by mouth every 4 (four) hours as needed. 08/12/14  Yes Roselee Culver, MD  cloNIDine HCl (KAPVAY) 0.1 MG TB12 ER tablet Take 0.1 mg by mouth 2 (two) times daily.   Yes Historical Provider, MD  escitalopram (LEXAPRO) 20 MG tablet Take 1 tablet (20 mg total) by mouth daily. 10/27/13  Yes Aurelio Jew, NP  hydrOXYzine (VISTARIL) 25 MG capsule Take 25 mg by mouth 2 (two) times daily. 08/08/14  Yes Historical Provider, MD  lamoTRIgine (LAMICTAL) 100 MG tablet Take 100 mg by mouth daily. 08/08/14  Yes Historical Provider, MD  levothyroxine (SYNTHROID, LEVOTHROID) 150 MCG tablet Take 150 mcg by mouth daily.  10/27/13  Yes Aurelio Jew, NP  lithium carbonate 300 MG capsule Take 1 capsule (300 mg total) by mouth 2 (two) times daily with a meal. 11/06/13  Yes Delfin Gant, NP  lurasidone (LATUDA) 80 MG TABS tablet Take 1 tablet (80 mg total) by mouth at bedtime. 10/27/13  Yes Aurelio Jew, NP  methylphenidate (CONCERTA) 18 MG CR tablet Take 18  mg by mouth daily.   Yes Historical Provider, MD  ORSYTHIA 0.1-20 MG-MCG tablet Take 1 tablet by mouth daily. 07/01/14  Yes Historical Provider, MD  VYVANSE 40 MG capsule Take 40 mg by mouth daily. 08/08/14  Yes Historical Provider, MD  naproxen sodium (ANAPROX DS) 550 MG tablet Take 1 tablet (550 mg total) by mouth 2 (two) times daily with a meal. 08/12/14 08/12/15  Roselee Culver, MD  norgestimate-ethinyl estradiol ([place] 28) 0.25-35 MG-MCG tablet 1 PO once daily. 12/11/13   Darlyne Russian,  MD  silver sulfADIAZINE (SILVADENE) 1 % cream Apply 1 application topically daily. 07/06/14   Araceli Bouche, PA    Physical Exam: Filed Vitals:   08/16/14 0300 08/16/14 0330 08/16/14 0400 08/16/14 0430  BP: 134/63 141/70 137/63 143/63  Pulse: 99 106 95 99  Temp:      TempSrc:      Resp: 22 19 18 22   Height:      Weight:      SpO2: 96% 97% 97% 98%     General:  Well-built and nourished.  Eyes: anicteric no pallor.  ENT: no discharge from the ears eyes nose mouth.  Neck: no mass felt.  Cardiovascular: S1-S2 heard.  Respiratory: no rhonchi or crepitations.  Abdomen: soft nontender bowel sounds present. No guarding or rigidity.  Skin: scar marks on her forearm.  Musculoskeletal: no edema.  Psychiatric: appears normal.  Neurologic: alert awake oriented to time place and person. Moves all extremities.  Labs on Admission:  Basic Metabolic Panel:  Recent Labs Lab 08/15/14 2335  NA 142  K 4.2  CL 103  CO2 23  GLUCOSE 89  BUN 15  CREATININE 0.77  CALCIUM 9.4  MG 2.0   Liver Function Tests:  Recent Labs Lab 08/15/14 2335  AST 42*  ALT 41*  ALKPHOS 118*  BILITOT <0.2*  PROT 7.2  ALBUMIN 3.5   No results for input(s): LIPASE, AMYLASE in the last 168 hours. No results for input(s): AMMONIA in the last 168 hours. CBC:  Recent Labs Lab 08/15/14 2335  WBC 18.1*  HGB 13.2  HCT 41.6  MCV 78.3  PLT 404*   Cardiac Enzymes: No results for input(s): CKTOTAL, CKMB, CKMBINDEX, TROPONINI in the last 168 hours.  BNP (last 3 results) No results for input(s): PROBNP in the last 8760 hours. CBG: No results for input(s): GLUCAP in the last 168 hours.  Radiological Exams on Admission: No results found.  EKG: Independently reviewed. Normal sinus rhythm.  Assessment/Plan Principal Problem:   Salicylate overdose Active Problems:   Hashimoto's thyroiditis   MDD (major depressive disorder), recurrent episode, moderate   1. Salicylate overdose - patient  states it was accidental. Patient has had previous episodes of suicidal attempts so we will keep under suicide precaution with sitter. I have discussed with poison control and poison control has advised to continue with hydration and repeat salicylate levels. A repeat salicylate levels show a decreasing trend then we can stop checking salicylate levels. If repeat salicylate levels are showing an increasing trend in me should check salicylate levels and metabolic panel every 3 hours. His salicylate levels are more than 40 then patient will need alkaline diuresis with bicarbonate infusion. If patient becomes confused levels tend to be around 100 and patient will be a dialysis candidate. Poison control did fax salicylate overdose management forms. Closely observe and stepdown. Consult psychiatric. 2. Elevated LFTs - closely follow LFTs. Check hepatitis panel. 3. Hypothyroidism - continue Synthroid. Check TSH. 4.  Major depression - continue present medications. See #1. 5. ADD - continue present medications.    Code Status: full code.  Family Communication: patient's dad at the bedside.  Disposition Plan: admit to inpatient.    Giovannie Scerbo N. [facility] Pager [phone].  If 7PM-7AM, please contact night-coverage [email or web address] Password [facility] 08/16/2014, 4:48 AM

## 2014-08-16 NOTE — Care Management Note (Signed)
    Page 1 of 2   08/16/2014     12:24:52 PM CARE MANAGEMENT NOTE 08/16/2014  Patient:  Trani,Neeta   Account Number:  [id number]  Date Initiated:  08/16/2014  Documentation initiated by:  DAVIS,RHONDA  Subjective/Objective Assessment:   took 20 325 mg asa tablets then reported this to parents,     Action/Plan:   home when stable   Anticipated DC Date:  08/19/2014   Anticipated DC Plan:  HOME/SELF CARE  In-house referral  NA      DC Planning Services  CM consult      PAC Choice  NA   Choice offered to / List presented to:  NA      DME agency  NA        [place] agency  NA   Status of service:  In process, will continue to follow Medicare Important Message given?   (If response is "NO", the following Medicare IM given date fields will be blank) Date Medicare IM given:   Medicare IM given by:   Date Additional Medicare IM given:   Additional Medicare IM given by:    Discharge Disposition:    Per UR Regulation:  Reviewed for med. necessity/level of care/duration of stay  If discussed at [place] of Stay Meetings, dates discussed:    Comments:  [id number]/Rhonda Rosana Hoes, RN, BSN, CCM Chart reviewed. Discharge needs and patient's stay to be reviewed and followed by case manager. Chart note for progression of stay: Principal Problem:   Salicylate overdose - salicylate level 5.7 --> 12.4 --> 17 - given charcoal twice - admission to SDU for further monitoring - repeat salicylate level   - psych evaluation - consult requested
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
## 2014-08-17 ENCOUNTER — Inpatient Hospital Stay (HOSPITAL_COMMUNITY)
Admission: AD | Admit: 2014-08-17 | Discharge: 2014-08-22 | DRG: 885 | Disposition: A | Discharge status: Home / Self Care | Payer: BC Managed Care – PPO | Source: Intra-hospital | Attending: Psychiatry | Admitting: Psychiatry

## 2014-08-17 ENCOUNTER — Encounter (HOSPITAL_COMMUNITY): Payer: Self-pay

## 2014-08-17 DIAGNOSIS — T39092D Poisoning by salicylates, intentional self-harm, subsequent encounter: Secondary | ICD-10-CM

## 2014-08-17 DIAGNOSIS — F909 Attention-deficit hyperactivity disorder, unspecified type: Secondary | ICD-10-CM | POA: Diagnosis present

## 2014-08-17 DIAGNOSIS — F3181 Bipolar II disorder: Principal | ICD-10-CM | POA: Diagnosis present

## 2014-08-17 DIAGNOSIS — E063 Autoimmune thyroiditis: Secondary | ICD-10-CM | POA: Diagnosis present

## 2014-08-17 DIAGNOSIS — F17210 Nicotine dependence, cigarettes, uncomplicated: Secondary | ICD-10-CM | POA: Diagnosis present

## 2014-08-17 DIAGNOSIS — F902 Attention-deficit hyperactivity disorder, combined type: Secondary | ICD-10-CM | POA: Diagnosis present

## 2014-08-17 DIAGNOSIS — F329 Major depressive disorder, single episode, unspecified: Secondary | ICD-10-CM | POA: Diagnosis present

## 2014-08-17 DIAGNOSIS — Z9114 Patient's other noncompliance with medication regimen: Secondary | ICD-10-CM | POA: Diagnosis present

## 2014-08-17 DIAGNOSIS — Z79899 Other long term (current) drug therapy: Secondary | ICD-10-CM

## 2014-08-17 DIAGNOSIS — F3130 Bipolar disorder, current episode depressed, mild or moderate severity, unspecified: Secondary | ICD-10-CM | POA: Insufficient documentation

## 2014-08-17 DIAGNOSIS — F4312 Post-traumatic stress disorder, chronic: Secondary | ICD-10-CM | POA: Diagnosis present

## 2014-08-17 LAB — SALICYLATE LEVEL

## 2014-08-17 MED ORDER — NICOTINE POLACRILEX 2 MG MT GUM
2.0000 mg | CHEWING_GUM | OROMUCOSAL | Status: DC | PRN
  Administered 2014-08-17: 2 mg via ORAL
  Filled 2014-08-17: qty 1

## 2014-08-17 MED ORDER — LEVOTHYROXINE SODIUM 150 MCG PO TABS
150.0000 ug | ORAL_TABLET | Freq: Every day | ORAL | Status: DC
  Administered 2014-08-18 – 2014-08-22 (×5): 150 ug via ORAL
  Filled 2014-08-17 (×2): qty 1
  Filled 2014-08-17: qty 2
  Filled 2014-08-17 (×3): qty 1
  Filled 2014-08-17: qty 14
  Filled 2014-08-17: qty 1
  Filled 2014-08-17: qty 14

## 2014-08-17 MED ORDER — LAMOTRIGINE 100 MG PO TABS
100.0000 mg | ORAL_TABLET | Freq: Every day | ORAL | Status: DC
  Administered 2014-08-18 – 2014-08-22 (×5): 100 mg via ORAL
  Filled 2014-08-17: qty 14
  Filled 2014-08-17 (×4): qty 1
  Filled 2014-08-17: qty 14
  Filled 2014-08-17 (×2): qty 1

## 2014-08-17 MED ORDER — ALUM & MAG HYDROXIDE-SIMETH 200-200-20 MG/5ML PO SUSP: 30.0000 mL | ORAL | Status: DC | PRN

## 2014-08-17 MED ORDER — CLONIDINE HCL ER 0.1 MG PO TB12
0.1000 mg | ORAL_TABLET | Freq: Two times a day (BID) | ORAL | Status: DC
  Filled 2014-08-17 (×2): qty 1

## 2014-08-17 MED ORDER — ACETAMINOPHEN 325 MG PO TABS
650.0000 mg | ORAL_TABLET | Freq: Four times a day (QID) | ORAL | Status: DC | PRN
  Administered 2014-08-18: 650 mg via ORAL
  Filled 2014-08-17: qty 2

## 2014-08-17 MED ORDER — HYDROXYZINE HCL 25 MG PO TABS
25.0000 mg | ORAL_TABLET | Freq: Four times a day (QID) | ORAL | Status: DC | PRN
  Administered 2014-08-20 – 2014-08-21 (×3): 25 mg via ORAL
  Filled 2014-08-17 (×3): qty 1

## 2014-08-17 MED ORDER — LEVONORGESTREL-ETHINYL ESTRAD 0.1-20 MG-MCG PO TABS
1.0000 | ORAL_TABLET | Freq: Every day | ORAL | Status: DC
  Administered 2014-08-19 – 2014-08-22 (×4): 1 via ORAL

## 2014-08-17 MED ORDER — LITHIUM CARBONATE 300 MG PO CAPS
300.0000 mg | ORAL_CAPSULE | Freq: Two times a day (BID) | ORAL | Status: DC
  Administered 2014-08-18 – 2014-08-19 (×3): 300 mg via ORAL
  Filled 2014-08-17 (×7): qty 1

## 2014-08-17 MED ORDER — MAGNESIUM HYDROXIDE 400 MG/5ML PO SUSP: 30.0000 mL | Freq: Every day | ORAL | Status: DC | PRN

## 2014-08-17 MED ORDER — METHYLPHENIDATE HCL ER (OSM) 18 MG PO TBCR
18.0000 mg | EXTENDED_RELEASE_TABLET | Freq: Every day | ORAL | Status: DC
  Administered 2014-08-18 – 2014-08-22 (×5): 18 mg via ORAL
  Filled 2014-08-17 (×5): qty 1

## 2014-08-17 MED ORDER — TRAZODONE HCL 50 MG PO TABS
50.0000 mg | ORAL_TABLET | Freq: Every evening | ORAL | Status: DC | PRN
  Administered 2014-08-17 – 2014-08-21 (×5): 50 mg via ORAL
  Filled 2014-08-17: qty 28
  Filled 2014-08-17 (×7): qty 1
  Filled 2014-08-17: qty 28
  Filled 2014-08-17: qty 1
  Filled 2014-08-17: qty 28
  Filled 2014-08-17 (×2): qty 1
  Filled 2014-08-17: qty 28
  Filled 2014-08-17 (×5): qty 1

## 2014-08-17 MED ORDER — LURASIDONE HCL 80 MG PO TABS
80.0000 mg | ORAL_TABLET | Freq: Every day | ORAL | Status: DC
  Administered 2014-08-17: 80 mg via ORAL
  Filled 2014-08-17 (×3): qty 1

## 2014-08-17 MED ORDER — HYDROXYZINE HCL 25 MG PO TABS: 25.0000 mg | ORAL_TABLET | Freq: Two times a day (BID) | ORAL | Status: DC

## 2014-08-17 MED ORDER — ESCITALOPRAM OXALATE 20 MG PO TABS
20.0000 mg | ORAL_TABLET | Freq: Every day | ORAL | Status: DC
Start: 2014-08-18 — End: 2014-08-22
  Administered 2014-08-18 – 2014-08-22 (×5): 20 mg via ORAL
  Filled 2014-08-17: qty 1
  Filled 2014-08-17: qty 14
  Filled 2014-08-17 (×5): qty 1
  Filled 2014-08-17: qty 2
  Filled 2014-08-17: qty 14

## 2014-08-17 MED ORDER — CLONIDINE HCL ER 0.1 MG PO TB12
0.1000 mg | ORAL_TABLET | Freq: Two times a day (BID) | ORAL | Status: DC
  Administered 2014-08-18 – 2014-08-22 (×9): 0.1 mg via ORAL
  Filled 2014-08-17 (×13): qty 1

## 2014-08-17 NOTE — Progress Notes (Signed)
Clinical Social Work  CSW received a call from MD reporting that patient is medically stable. RN reports patient and family are only interested in [place]. CSW spoke with [facility] Randall Hiss) who reports he will check bed status and let CSW know if any beds are available. If [facility] is unable to accept patient then CSW will talk with patient about other placement options.  [place],  [phone]
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
## 2014-08-17 NOTE — Tx Team (Signed)
Initial Interdisciplinary Treatment Plan  PATIENT STRESSORS: Educational concerns Marital or family conflict Substance abuse   PATIENT STRENGTHS: Ability for insight Active sense of humor Capable of independent living Supportive family/friends   PROBLEM LIST: Problem List/Patient Goals Date to be addressed Date deferred Reason deferred Estimated date of resolution  Suicidal Ideations 08/17/2014 08/17/2014     "Opening Up More" 08/17/2014  08/17/2014     "Trusting Others" 08/17/2014  08/17/2014     "Working on My Anger" 08/17/2014 08/17/2014      Anxiety 08/17/2014  08/17/2014                              DISCHARGE CRITERIA:  Improved stabilization in mood, thinking, and/or behavior Motivation to continue treatment in a less acute level of care  PRELIMINARY DISCHARGE PLAN: Participate in family therapy Return to previous work or school arrangements  PATIENT/FAMIILY INVOLVEMENT: This treatment plan has been presented to and reviewed with the patient, Kialee Kham.  The patient and family have been given the opportunity to ask questions and make suggestions.  Elenore Rota 08/17/2014, 7:46 PM

## 2014-08-17 NOTE — Progress Notes (Signed)
Pt transferring to [facility]; report called to Clarise Cruz, RN who denied any questions/concerns; pt is stable at time of transport; Father is with patient at this time; [facility] transport to take pt to facility

## 2014-08-17 NOTE — Discharge Summary (Signed)
Physician Discharge Summary  [name] OIN:[id number] DOB: 1995/10/30 DOA: 08/15/2014  PCP: Maurice Small, D, MD  Admit date: 08/15/2014 Discharge date: 08/17/2014  Recommendations for Outpatient Follow-up:  1. Discharge to behavioral health.  Discharge Diagnoses:  Principal Problem:   Salicylate overdose Active Problems:   Hashimoto's thyroiditis   MDD (major depressive disorder), recurrent episode, moderate   Suicidal intent    Discharge Condition: stable   Diet recommendation: as tolerated   History of present illness:  18 y.o. female with history of depression, ADD and hypothyroidism who presented to [facility] ED 70/96/2836 for salicylate overdose. Patient apparently swallowed 20 pills of 325 mg aspirin tablets 1 day PTA and reported to her parents. Her parents induced vomiting and brought her to the ER. Patient denied suicidal ideation on this admission but has had suicidal attempt in past. In ED, patient was hemodynamically stable. Salicylate level was 5.7 on admission, then 12.4 and 17. Poison control was contacted and advised giving normal saline bolus and charcoal. The charcoal was given twice. Patient is admitted to stepdown unit for further management of salicylate overdose.  Assessment/Plan:    Principal Problem:  Salicylate overdose - patient was admitted to stepdown unit for monitoring. She was given charcoal twice in ED. - salicylate level 5.7 --> 12.4 --> 17 --> WNL - patient is stable for discharge to behavioral health.   Active problems: Suicidal ideations / depression - May resume previous home medications per psychiatry recommendations. - psych consulted and recommendation for behavioral hospitalization. Patient and her mother at the bedside in agreement for [facility] hospitalization  DVT Prophylaxis  - Lovenox subQ ordered.   Code Status: Full.    IV access:   Peripheral IV  Procedures and diagnostic studies:    Imaging Results (Last 48  hours)    No results found.    Medical Consultants:   Psychiatry  Other Consultants:   None  IAnti-Infectives:    None    Signed:  Leisa Lenz, MD  [facility] 08/17/2014, 10:17 AM  Pager #: [phone]  Discharge Exam: Filed Vitals:   08/17/14 0800  BP: 111/45  Pulse: 68  Temp: 97.9 F (36.6 C)  Resp: 13   Filed Vitals:   08/17/14 0200 08/17/14 0344 08/17/14 0600 08/17/14 0800  BP: 109/60  122/60 111/45  Pulse: 67  75 68  Temp:  98.1 F (36.7 C)  97.9 F (36.6 C)  TempSrc:  Oral  Oral  Resp: 15  20 13   Height:      Weight:      SpO2: 96%  98% 97%    General: Pt is alert, follows commands appropriately, not in acute distress Cardiovascular: Regular rate and rhythm, S1/S2 +, no murmurs Respiratory: Clear to auscultation bilaterally, no wheezing, no crackles, no rhonchi Abdominal: Soft, non tender, non distended, bowel sounds +, no guarding Extremities: no edema, no cyanosis, pulses palpable bilaterally DP and PT Neuro: Grossly nonfocal  Discharge Instructions  Discharge Instructions    Call MD for:  difficulty breathing, headache or visual disturbances    Complete by:  As directed      Call MD for:  persistant dizziness or light-headedness    Complete by:  As directed      Call MD for:  persistant nausea and vomiting    Complete by:  As directed      Call MD for:  severe uncontrolled pain    Complete by:  As directed      Diet - low sodium  heart healthy    Complete by:  As directed      Increase activity slowly    Complete by:  As directed             Medication List    STOP taking these medications        hydrOXYzine 25 MG capsule  Commonly known as:  VISTARIL     naproxen sodium 550 MG tablet  Commonly known as:  ANAPROX DS      TAKE these medications        acetaminophen-codeine 300-30 MG per tablet  Commonly known as:  TYLENOL #3  Take 1-2 tablets by mouth every 4 (four) hours as needed.     cloNIDine HCl 0.1  MG Tb12 ER tablet  Commonly known as:  KAPVAY  Take 0.1 mg by mouth 2 (two) times daily.     escitalopram 20 MG tablet  Commonly known as:  LEXAPRO  Take 1 tablet (20 mg total) by mouth daily.     hydrOXYzine 25 MG tablet  Commonly known as:  ATARAX/VISTARIL  Take 1 tablet (25 mg total) by mouth 2 (two) times daily.     lamoTRIgine 100 MG tablet  Commonly known as:  LAMICTAL  Take 100 mg by mouth daily.     levothyroxine 150 MCG tablet  Commonly known as:  SYNTHROID, LEVOTHROID  Take 150 mcg by mouth daily.     lithium carbonate 300 MG capsule  Take 1 capsule (300 mg total) by mouth 2 (two) times daily with a meal.     lurasidone 80 MG Tabs tablet  Commonly known as:  LATUDA  Take 1 tablet (80 mg total) by mouth at bedtime.     methylphenidate 18 MG CR tablet  Commonly known as:  CONCERTA  Take 18 mg by mouth daily.     norgestimate-ethinyl estradiol 0.25-35 MG-MCG tablet  Commonly known as:  SPRINTEC 28  1 PO once daily.     ORSYTHIA 0.1-20 MG-MCG tablet  Generic drug:  levonorgestrel-ethinyl estradiol  Take 1 tablet by mouth daily.     silver sulfADIAZINE 1 % cream  Commonly known as:  SILVADENE  Apply 1 application topically daily.     VYVANSE 40 MG capsule  Generic drug:  lisdexamfetamine  Take 40 mg by mouth daily.           Follow-up Information    Follow up with WEBB, CAROL, D, MD In 2 weeks.   Specialty:  Family Medicine   Why:  Follow up appt after recent hospitalization, As needed   Contact information:   [address] [address] [phone]        The results of significant diagnostics from this hospitalization (including imaging, microbiology, ancillary and laboratory) are listed below for reference.    Significant Diagnostic Studies: Dg Knee Complete 4 Views Left  08/12/2014   CLINICAL DATA:  Left knee pain, trauma 1 week ago  EXAM: LEFT KNEE - COMPLETE 4+ VIEW  COMPARISON:  None.  FINDINGS: There is no  evidence of fracture, dislocation, or joint effusion. There is no evidence of arthropathy or other focal bone abnormality. Soft tissues are unremarkable.  IMPRESSION: Negative.   Electronically Signed   By: Conchita Paris M.D.   On: 08/12/2014 15:56    Microbiology: Recent Results (from the past 240 hour(s))  MRSA PCR Screening     Status: None   Collection Time: 08/16/14 10:11 AM  Result Value Ref Range  Status   MRSA by PCR NEGATIVE NEGATIVE Final    Comment:        The GeneXpert MRSA Assay (FDA approved for NASAL specimens only), is one component of a comprehensive MRSA colonization surveillance program. It is not intended to diagnose MRSA infection nor to guide or monitor treatment for MRSA infections.      Labs: Basic Metabolic Panel:  Recent Labs Lab 08/15/14 2335 08/16/14 1129  NA 142 141  K 4.2 3.9  CL 103 106  CO2 23 21  GLUCOSE 89 110*  BUN 15 8  CREATININE 0.77 0.71  CALCIUM 9.4 9.4  MG 2.0  --    Liver Function Tests:  Recent Labs Lab 08/15/14 2335 08/16/14 1129  AST 42* 40*  ALT 41* 44*  ALKPHOS 118* 126*  BILITOT <0.2* <0.2*  PROT 7.2 7.3  ALBUMIN 3.5 3.6   No results for input(s): LIPASE, AMYLASE in the last 168 hours. No results for input(s): AMMONIA in the last 168 hours. CBC:  Recent Labs Lab 08/15/14 2335 08/16/14 1129  WBC 18.1* 15.6*  NEUTROABS  --  11.9*  HGB 13.2 13.2  HCT 41.6 41.1  MCV 78.3 77.7*  PLT 404* 463*   Cardiac Enzymes: No results for input(s): CKTOTAL, CKMB, CKMBINDEX, TROPONINI in the last 168 hours. BNP: BNP (last 3 results) No results for input(s): PROBNP in the last 8760 hours. CBG: No results for input(s): GLUCAP in the last 168 hours.  Time coordinating discharge: Over 30 minutes
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
## 2014-08-17 NOTE — Discharge Instructions (Signed)
Aspirin Overdose Taking too much aspirin, drugs similar to aspirin, or a drug that includes aspirin can cause serious problems. Aspirin or drugs similar to aspirin are part of many prescription and non-prescription drugs. Some of these are:  Pain medicines  Cold medicines.  Drugs to help stop diarrhea.  Drugs to help an upset stomach.  Muscle relaxants.  Some herbal medicines. Not all of these medicines have aspirin in them; but some do. You need to read and understand the label on the bottle to know for sure. Aspirin overdose can happen quickly if someone takes too much at one time. Aspirin overdose can also happen over time in people who take a high dose of an aspirin-containing drug over a long period of time.  SYMPTOMS  In large doses, aspirin can cause serious toxicity. Symptoms of mild to moderate overdose include:  Ringing in the ears.  Nausea.  Vomiting.  Hyper ventilation.  Dehydration.  Sweating.  Fever. More serious ingestions can cause:  Bleeding.  Difficulty with breathing.  Confusion.  Delirium.  Seizures.  Shock. DIAGNOSIS  Checking for aspirin overdose may require blood tests for salicylate levels. Salicylate is a chemical in aspirin. Peak blood levels after taking aspirin are usually reached in 6-12 hours. Several salicylate levels are often measured.  TREATMENT  Treatment depends on the time and amount taken.  Emptying the stomach may be needed.  Activated charcoal can be used to decrease the drug entering your blood.  You may be observed at home if:  Your blood salicylate level is low.  Your symptoms are mild.  If the problem is more serious, you will need to stay in the hospital. [place] IF:   You develop repeated vomiting or are vomiting blood.  You develop other bleeding problems.  You develop a high fever.  You experience confusion, agitation, seizures.  You are fainting or fear you may go into a  coma. Document Released: 10/24/2004 Document Revised: 12/09/2011 Document Reviewed: 07/14/2008 [facility] Patient Information 2015 [place], [place]. This information is not intended to replace advice given to you by your health care provider. Make sure you discuss any questions you have with your health care provider.

## 2014-08-17 NOTE — Treatment Plan (Signed)
Pt has been accepted to [facility] by Dr. Louretta Shorten to room 300-01 which will be ready after 1700 today.  Please call report to [phone] before the patient leaves the floor.  Thank you.

## 2014-08-17 NOTE — Progress Notes (Signed)
Clinical Social Work  Patient accepted to [facility] 300-1. RN to call report to [phone]. CSW informed patient and father at bedside. Patient signed voluntary form which was faxed to [facility] and original copy placed on chart. CSW coordinated transportation via [place] for 5 pm pick up, per [facility] request. CSW is signing off but available if needed.  [place],  [phone]

## 2014-08-18 DIAGNOSIS — F902 Attention-deficit hyperactivity disorder, combined type: Secondary | ICD-10-CM

## 2014-08-18 LAB — URINALYSIS, ROUTINE W REFLEX MICROSCOPIC
BILIRUBIN URINE: NEGATIVE
Glucose, UA: NEGATIVE mg/dL
HGB URINE DIPSTICK: NEGATIVE
KETONES UR: NEGATIVE mg/dL
Nitrite: NEGATIVE
PH: 5.5 (ref 5.0–8.0)
Protein, ur: NEGATIVE mg/dL
SPECIFIC GRAVITY, URINE: 1.018 (ref 1.005–1.030)
UROBILINOGEN UA: 0.2 mg/dL (ref 0.0–1.0)

## 2014-08-18 LAB — CBC
HCT: 43.5 % (ref 36.0–46.0)
Hemoglobin: 13.9 g/dL (ref 12.0–15.0)
MCH: 24.9 pg — ABNORMAL LOW (ref 26.0–34.0)
MCHC: 32 g/dL (ref 30.0–36.0)
MCV: 78 fL (ref 78.0–100.0)
Platelets: 484 10*3/uL — ABNORMAL HIGH (ref 150–400)
RBC: 5.58 MIL/uL — ABNORMAL HIGH (ref 3.87–5.11)
RDW: 14.8 % (ref 11.5–15.5)
WBC: 14.8 10*3/uL — ABNORMAL HIGH (ref 4.0–10.5)

## 2014-08-18 LAB — URINE MICROSCOPIC-ADD ON

## 2014-08-18 LAB — LITHIUM LEVEL: Lithium Lvl: <0.25 mEq/L — ABNORMAL LOW (ref 0.80–1.40)

## 2014-08-18 LAB — TSH: TSH: 2.62 u[IU]/mL (ref 0.350–4.500)

## 2014-08-18 MED ORDER — LURASIDONE HCL 40 MG PO TABS
40.0000 mg | ORAL_TABLET | Freq: Every day | ORAL | Status: DC
  Administered 2014-08-18: 40 mg via ORAL
  Filled 2014-08-18 (×4): qty 1

## 2014-08-18 MED ORDER — NICOTINE 21 MG/24HR TD PT24
21.0000 mg | MEDICATED_PATCH | Freq: Every day | TRANSDERMAL | Status: DC
Start: 2014-08-18 — End: 2014-08-22
  Administered 2014-08-18 – 2014-08-22 (×4): 21 mg via TRANSDERMAL
  Filled 2014-08-18 (×8): qty 1

## 2014-08-18 NOTE — Progress Notes (Signed)
Pt attended NA group this evening.  

## 2014-08-18 NOTE — Progress Notes (Signed)
D   Pt is pleasant on approach and interacts well with staff and peers  She attended group this evening and was appropriate   She requested her leg support since her knee was acting up again  A   Verbal support given   Medications administered and effectiveness monitored   Q 15 min checks R   Pt receptive to verbal support and is presently safe

## 2014-08-18 NOTE — [facility] Group Notes (Signed)
[place] LCSW Group Therapy 08/18/2014 1:15 PM Type of Therapy: Group Therapy Participation Level: Active  Participation Quality: Attentive, Sharing and Supportive  Affect: Appropriate  Cognitive: Alert and Oriented  Insight: Developing/Improving and Engaged  Engagement in Therapy: Developing/Improving and Engaged  Modes of Intervention: Activity, Clarification, Confrontation, Discussion, Education, Exploration, Limit-setting, Orientation, Problem-solving, Rapport Building, Art therapist, Socialization and Support  Summary of Progress/Problems: Patient was attentive and engaged with speaker from [place]. Patient was attentive to speaker while they shared their story of dealing with mental health and overcoming it. Patient expressed interest in their programs and services and received information on their agency. Patient processed ways they can relate to the speaker. Patient presented to group halfway through presentation.  Tilden Fossa, MSW, [place] Worker [facility] [phone]
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
## 2014-08-18 NOTE — BHH Suicide Risk Assessment (Signed)
   Nursing information obtained from:    Demographic factors:   18 year old, single, lives with parents, in college Current Mental Status:   See below Loss Factors:   Stressors related to college Historical Factors:   history of Mood Disorder, history of PTSD  Risk Reduction Factors:   sense of responsibility to family, resilience  Total Time spent with patient: 45 minutes  CLINICAL FACTORS:  Depression, impulsivity, recent overdose on ASA- at this time states it was not suicidal attempt  Psychiatric Specialty Exam: Physical Exam  ROS  Blood pressure 103/66, pulse 130, temperature 98.6 F (37 C), temperature source Oral, resp. rate 16, height 5\' 3"  (1.6 m), weight 89.812 kg (198 lb), last menstrual period 08/09/2014.Body mass index is 35.08 kg/(m^2).  See Admit Note MSE   COGNITIVE FEATURES THAT CONTRIBUTE TO RISK:  Closed-mindedness    SUICIDE RISK:   Moderate:  Frequent suicidal ideation with limited intensity, and duration, some specificity in terms of plans, no associated intent, good self-control, limited dysphoria/symptomatology, some risk factors present, and identifiable protective factors, including available and accessible social support.  PLAN OF CARE: Patient will be admitted to inpatient psychiatric unit for stabilization and safety. Will provide and encourage milieu participation. Provide medication management and maked adjustments as needed.  Will follow daily.    I certify that inpatient services furnished can reasonably be expected to improve the patient's condition.  COBOS, FERNANDO 08/18/2014, 2:56 PM

## 2014-08-18 NOTE — Progress Notes (Signed)
Recreation Therapy Notes  Animal-Assisted Activity/Therapy (AAA/T) Program Checklist/Progress Notes Patient Eligibility Criteria Checklist & Daily Group note for Rec Tx Intervention  Date: 11.19.2015 Time: 2:45pm Location: [address]    AAA/T Program Assumption of Risk Form signed by Patient/ or Parent Legal Guardian yes  Patient is free of allergies or sever asthma yes  Patient reports no fear of animals yes  Patient reports no history of cruelty to animals yes   Patient understands his/her participation is voluntary yes  Patient washes hands before animal contact yes  Patient washes hands after animal contact yes  Behavioral Response: Appropriate   Education: Hand Washing, Appropriate Animal Interaction   Education Outcome: Acknowledges education.   Clinical Observations/Feedback: Patient engaged in session, petting therapy dog appropriately and sharing stories about pets she has at home.   Laureen Ochs Bradrick Kamau, LRT/CTRS  Lane Hacker 08/18/2014 4:19 PM

## 2014-08-19 MED ORDER — LITHIUM CARBONATE 300 MG PO CAPS
300.0000 mg | ORAL_CAPSULE | Freq: Every morning | ORAL | Status: DC
  Administered 2014-08-20 – 2014-08-22 (×3): 300 mg via ORAL
  Filled 2014-08-19 (×2): qty 1
  Filled 2014-08-19: qty 14
  Filled 2014-08-19: qty 1
  Filled 2014-08-19: qty 14
  Filled 2014-08-19 (×2): qty 1

## 2014-08-19 MED ORDER — LITHIUM CARBONATE 300 MG PO CAPS
600.0000 mg | ORAL_CAPSULE | Freq: Every day | ORAL | Status: DC
  Administered 2014-08-19 – 2014-08-21 (×3): 600 mg via ORAL
  Filled 2014-08-19: qty 28
  Filled 2014-08-19 (×2): qty 2
  Filled 2014-08-19: qty 28
  Filled 2014-08-19 (×2): qty 2

## 2014-08-19 MED ORDER — LURASIDONE HCL 40 MG PO TABS
20.0000 mg | ORAL_TABLET | Freq: Every day | ORAL | Status: DC
  Administered 2014-08-19 – 2014-08-21 (×3): 20 mg via ORAL
  Filled 2014-08-19: qty 1
  Filled 2014-08-19 (×2): qty 7
  Filled 2014-08-19 (×3): qty 1

## 2014-08-19 NOTE — BHH Group Notes (Signed)
Adult Psychoeducational Group Note  Date:  08/19/2014 Time:  9:43 PM  Group Topic/Focus:  AA Meeting  Participation Level:  Minimal  Participation Quality:  Attentive  Affect:  Appropriate  Cognitive:  Alert  Insight: Limited  Engagement in Group:  Limited  Modes of Intervention:  Discussion and Education  Additional Comments:  At first [name] was constantly moving around and couldn't sit still.  She left and came back.  When she returned she was more calm and able to sit still.  Victorino Sparrow A 08/19/2014, 9:43 PM

## 2014-08-19 NOTE — Progress Notes (Signed)
D: Patient is alert and oriented. Pt's mood and affect is pleasant but superficial and animated. Pt reports depression, hopelessness, and anxiety 0/10. Pt states her goal for the day is "trusting more" and she plans to accomplish this by "little by little open up." Pt denies SI/HI and AVH at this time. Pt is attending groups. A: Urine collected. Scheduled medications administered per providers orders (See MAR). 15 minute checks completed per protocol for pt safety. R: Pt cooperative and receptive to nursing interventions.
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
## 2014-08-19 NOTE — BHH Group Notes (Signed)
   [facility] LCSW Aftercare Discharge Planning Group Note  08/19/2014  8:45 AM   Participation Quality: Alert, Appropriate and Oriented  Mood/Affect: Appropriate  Depression Rating: 0  Anxiety Rating: 2  Thoughts of Suicide: Pt denies SI/HI  Will you contract for safety? Yes  Current AVH: Pt denies  Plan for Discharge/Comments: Pt attended discharge planning group and actively participated in group. CSW provided pt with today's workbook. Patient reports feeling tired this morning, states that she had nightmares last night. Plans to return home with her parents and follow up with [facility].  Transportation Means: Pt reports access to transportation via parents  Supports: Patient identifies her parents as supports.  Tilden Fossa, MSW, [place] Worker [facility] [phone]
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
## 2014-08-19 NOTE — BHH Group Notes (Signed)
[place] LCSW Group Therapy 08/19/2014 1:15 PM Type of Therapy: Group Therapy Participation Level: Active  Participation Quality: Attentive, Sharing and Supportive  Affect: Depressed and Flat  Cognitive: Alert and Oriented  Insight: Developing/Improving and Engaged  Engagement in Therapy: Developing/Improving and Engaged  Modes of Intervention: Clarification, Confrontation, Discussion, Education, Exploration, Limit-setting, Orientation, Problem-solving, Rapport Building, Art therapist, Socialization and Support  Summary of Progress/Problems: The topic for today was feelings about relapse. Pt discussed what relapse prevention is to them and identified triggers that they are on the path to relapse. Pt processed their feeling towards relapse and was able to relate to peers. Pt discussed coping skills that can be used for relapse prevention. Patient identified her relapse behaviors as cutting and anger. Patient reports that her current hospitalization has helped her to realize how her behaviors are effecting her family. Patient expressed interest in the Mental Health Association classes and identified healthy coping strategies for releasing her emotions. CSW provided emotional support and encouragement.   Tilden Fossa, MSW, [place] Worker [facility] [phone]

## 2014-08-19 NOTE — [facility] Suicide Risk Assessment (Signed)
[facility] INPATIENT:  Family/Significant Other Suicide Prevention Education  Suicide Prevention Education:  Education Completed; father France Lusty [phone],  (name of family member/significant other) has been identified by the patient as the family member/significant other with whom the patient will be residing, and identified as the person(s) who will aid the patient in the event of a mental health crisis (suicidal ideations/suicide attempt).  With written consent from the patient, the family member/significant other has been provided the following suicide prevention education, prior to the and/or following the discharge of the patient.  The suicide prevention education provided includes the following:  Suicide risk factors  Suicide prevention and interventions  National Suicide Hotline telephone number  [facility] assessment telephone number  [facility] Emergency Assistance [place] and/or Residential Mobile Crisis Unit telephone number  Request made of family/significant other to:  Remove weapons (e.g., guns, rifles, knives), all items previously/currently identified as safety concern.    Remove drugs/medications (over-the-counter, prescriptions, illicit drugs), all items previously/currently identified as a safety concern.  The family member/significant other verbalizes understanding of the suicide prevention education information provided.  The family member/significant other agrees to remove the items of safety concern listed above.  [name], Casimiro Needle 08/19/2014, 3:49 PM

## 2014-08-19 NOTE — [facility] Group Notes (Signed)
[facility] Group Notes:activities  Date:  08/19/2014  Time:  10:53 AM  Type of Therapy:  Psychoeducational Skills  Participation Level:  Active  Participation Quality:  Appropriate  Affect:  Appropriate  Cognitive:  Alert  Insight:  Appropriate  Engagement in Group:  Engaged  Modes of Intervention:  Discussion  Summary of Progress/Problems:  [name] 08/19/2014, 10:53 AM

## 2014-08-20 DIAGNOSIS — F4310 Post-traumatic stress disorder, unspecified: Secondary | ICD-10-CM

## 2014-08-20 DIAGNOSIS — F3181 Bipolar II disorder: Principal | ICD-10-CM

## 2014-08-20 LAB — HEPATITIS C ANTIBODY: HCV Ab: NEGATIVE

## 2014-08-20 LAB — GC/CHLAMYDIA PROBE AMP
CT PROBE, AMP APTIMA: NEGATIVE
GC PROBE AMP APTIMA: NEGATIVE

## 2014-08-20 LAB — HIV ANTIBODY (ROUTINE TESTING W REFLEX): HIV 1&2 Ab, 4th Generation: NONREACTIVE

## 2014-08-20 LAB — RPR

## 2014-08-20 MED ORDER — IBUPROFEN 600 MG PO TABS: 600.0000 mg | ORAL_TABLET | Freq: Four times a day (QID) | ORAL | Status: DC | PRN

## 2014-08-20 NOTE — Plan of Care (Signed)
Problem: Ineffective individual coping Goal: STG: Pt will be able to identify effective and ineffective STG: Pt will be able to identify effective and ineffective coping patterns  Outcome: Not Progressing Pt focused on pain medication Goal: STG: Patient will remain free from self harm Outcome: Progressing Aeb assessment

## 2014-08-20 NOTE — Progress Notes (Signed)
D- Patient is out in milieu and interacting with peers.Rates depression,anxiety, and hopelessness at 0.  Was not goal specific on patient inventory.  C/O knee pain which was reported to provider. A- Support and encouragement offered.  Continue current poc and evaluation of treatment goals. R-Safety maintained.

## 2014-08-20 NOTE — Progress Notes (Signed)
Patient ID: [name], female   DOB: 1996/02/21, 18 y.o.   MRN: [id number] [facility] MD Progress Note  08/20/2014 12:39 PM [name]  MRN:  [id number] Subjective:   Patient states "My mood is better. I always feel a bit hyper. Maybe I should not drink four cups of coffee daily. I'm just worried about my ankle. I jumped off a swing two weeks ago. I did not break anything. But my left ankle is feeling painful and swollen. I'm just hoping to leave on Monday."   Objective:  Patient is assessed in her room. She is focused on her left leg pain rather than any psychiatric issues. Attempts to minimize the symptoms related to her Bipolar Disorder during assessment. Patient is noted to laugh in response to the questions. Nursing staff report a similar presentation in the milieu. Patient is compliant with medications and denies any adverse effects. Assessed left ankle at her request. No obvious swelling or bruising noted. Some mild pain with full ROM of the joint. There is no obvious deformity noted. Observed patient ambulating around the unit without difficulty with left knee brace on. Advised patient to rest her ankle, apply ice and take motrin as needed. Patient is attending the scheduled groups and has been engaged. The patient has identified her relapse behaviors to be cutting and feeling angry. Her current Lithium level drawn on 08/18/14 of less than 0.25 indicates medication noncompliance prior to admission.   Diagnosis:  Bipolar Disorder II by history, currently depressed, PTSD   Total Time spent with patient: 20 minutes  ADL's: Intact   Sleep: good- does report PTSD associated nightmares  Appetite:  Good  Suicidal Ideation:  Denies any suicidal ideations or thoughts of cutting at this time Homicidal Ideation:  Denies  AEB (as evidenced by):  Psychiatric Specialty Exam: Physical Exam  Review of Systems  Constitutional: Negative for fever, chills, weight loss, malaise/fatigue and  diaphoresis.  HENT: Negative for congestion, ear discharge, ear pain, hearing loss, nosebleeds and tinnitus.   Eyes: Negative for blurred vision, double vision, photophobia, pain and discharge.  Respiratory: Negative for cough, hemoptysis, sputum production, shortness of breath and wheezing.   Cardiovascular: Negative for chest pain, palpitations, orthopnea, claudication and leg swelling.  Gastrointestinal: Negative for heartburn, nausea, vomiting, abdominal pain, diarrhea, constipation and blood in stool.  Genitourinary: Negative for dysuria, urgency and frequency.  Musculoskeletal: Positive for joint pain (Left ankle pain). Negative for myalgias.  Skin: Negative.  Negative for itching and rash.       Denies any bleeding or bruising ( recent ASA overdose)   Neurological: Negative for dizziness, seizures, weakness and headaches.  Endo/Heme/Allergies: Negative for environmental allergies and polydipsia. Does not bruise/bleed easily.  Psychiatric/Behavioral: Positive for depression. The patient is nervous/anxious.     Blood pressure 107/87, pulse 80, temperature 98.1 F (36.7 C), temperature source Oral, resp. rate 16, height 5\' 3"  (1.6 m), weight 89.812 kg (198 lb), last menstrual period 08/09/2014.Body mass index is 35.08 kg/(m^2).  General Appearance: Casual  Eye Contact::  Good  Speech:  Normal Rate  Volume:  Normal  Mood:  Anxious  Affect:  Congruent  Thought Process:  Goal Directed and Linear  Orientation:  Full (Time, Place, and Person)  Thought Content:  denies hallucinations , no delusions  Suicidal Thoughts:  No at this time denies any thoughts of hurting self and  contracts for safety on unit   Homicidal Thoughts:  No  Memory:  Recent and Remote grossly intact  Judgement:  Fair  Insight:  Fair  Psychomotor Activity:  Normal  Concentration:  Good  Recall:  Good  Fund of Knowledge:Good  Language: Good  Akathisia:  Negative  Handed:  Right  AIMS (if indicated):     Assets:   Communication Skills Desire for Improvement Resilience Social Support  Sleep:  Number of Hours: 6.75   Musculoskeletal: Strength & Muscle Tone: within normal limits Gait & Station: normal Patient leans: N/A  Current Medications: Current Facility-Administered Medications  Medication Dose Route Frequency Provider Last Rate Last Dose  . acetaminophen (TYLENOL) tablet 650 mg  650 mg Oral Q6H PRN Laverle Hobby, PA-C   650 mg at 08/18/14 1253  . alum & mag hydroxide-simeth (MAALOX/MYLANTA) 200-200-20 MG/5ML suspension 30 mL  30 mL Oral Q4H PRN Laverle Hobby, PA-C      . cloNIDine HCl (KAPVAY) ER tablet 0.1 mg  0.1 mg Oral BID Laverle Hobby, PA-C   0.1 mg at 08/20/14 0834  . escitalopram (LEXAPRO) tablet 20 mg  20 mg Oral Daily Laverle Hobby, PA-C   20 mg at 08/20/14 1610  . hydrOXYzine (ATARAX/VISTARIL) tablet 25 mg  25 mg Oral Q6H PRN Laverle Hobby, PA-C      . ibuprofen (ADVIL,MOTRIN) tablet 600 mg  600 mg Oral Q6H PRN Elmarie Shiley, NP      . lamoTRIgine (LAMICTAL) tablet 100 mg  100 mg Oral Daily Laverle Hobby, PA-C   100 mg at 08/20/14 0834  . levonorgestrel-ethinyl estradiol (AVIANE,ALESSE,LESSINA) 0.1-20 MG-MCG per tablet 1 tablet  1 tablet Oral Daily Laverle Hobby, PA-C   1 tablet at 08/20/14 0834  . levothyroxine (SYNTHROID, LEVOTHROID) tablet 150 mcg  150 mcg Oral QAC breakfast Laverle Hobby, PA-C   150 mcg at 08/20/14 0700  . lithium carbonate capsule 300 mg  300 mg Oral q morning - 10a Myer Peer Cobos, MD   300 mg at 08/20/14 1026  . lithium carbonate capsule 600 mg  600 mg Oral QHS Jenne Campus, MD   600 mg at 08/19/14 2145  . lurasidone (LATUDA) tablet 20 mg  20 mg Oral QHS Jenne Campus, MD   20 mg at 08/19/14 2145  . magnesium hydroxide (MILK OF MAGNESIA) suspension 30 mL  30 mL Oral Daily PRN Laverle Hobby, PA-C      . methylphenidate (CONCERTA) CR tablet 18 mg  18 mg Oral Daily Laverle Hobby, PA-C   18 mg at 08/20/14 0834  . nicotine (NICODERM CQ -  dosed in mg/24 hours) patch 21 mg  21 mg Transdermal Daily Laverle Hobby, PA-C   21 mg at 08/19/14 0830  . traZODone (DESYREL) tablet 50 mg  50 mg Oral QHS,MR X 1 Laverle Hobby, PA-C   50 mg at 08/19/14 2146    Lab Results:  Results for orders placed or performed during the hospital encounter of 08/17/14 (from the past 48 hour(s))  GC/Chlamydia Probe Amp     Status: None   Collection Time: 08/19/14  3:02 PM  Result Value Ref Range   CT Probe RNA NEGATIVE NEGATIVE   GC Probe RNA NEGATIVE NEGATIVE    Comment: (NOTE)                                                                                       **  Normal Reference Range: Negative**      Assay performed using the Gen-Probe APTIMA COMBO2 (R) Assay. Acceptable specimen types for this assay include APTIMA Swabs (Unisex, endocervical, urethral, or vaginal), first void urine, and ThinPrep liquid based cytology samples. Performed at [facility]   Hepatitis C antibody     Status: None   Collection Time: 08/19/14  7:50 PM  Result Value Ref Range   HCV Ab NEGATIVE NEGATIVE    Comment: Performed at [facility]    Physical Findings: AIMS: Facial and Oral Movements Muscles of Facial Expression: None, normal Lips and Perioral Area: None, normal Jaw: None, normal Tongue: None, normal,Extremity Movements Upper (arms, wrists, hands, fingers): None, normal Lower (legs, knees, ankles, toes): None, normal, Trunk Movements Neck, shoulders, hips: None, normal, Overall Severity Severity of abnormal movements (highest score from questions above): None, normal Incapacitation due to abnormal movements: None, normal Patient's awareness of abnormal movements (rate only patient's report): No Awareness, Dental Status Current problems with teeth and/or dentures?: No Does patient usually wear dentures?: No  CIWA:    COWS:     Treatment Plan Summary: Daily contact with patient to assess and evaluate symptoms and progress in  treatment Medication management See below  Plan: 1. Continue inpatient treatment , milieu, support 2. Continue Lamictal 100 mgrs QDAY for improved mood control.  3. Continue Lexapro 20 mgrs QDAY for depression 4. Continue Latuda now at 20 mgrs QDAY ( being tapered down) 5. Continue LiCO3 at 300 mgrs QAM and 600 mgrs QHS for improved mood control.  6. Address health issues: Vitals reviewed and stable. Order motrin 600 mg every six hours for complaints of left ankle pain.   Medical Decision Making Problem Points:  Established problem, stable/improving (1), Review of last therapy session (1) and Review of psycho-social stressors (1) Data Points:  Review or order clinical lab tests (1) Review of medication regiment & side effects (2) Review of new medications or change in dosage (2)  I certify that inpatient services furnished can reasonably be expected to improve the patient's condition.   Avyan Livesay NP-C 08/20/2014, 12:39 PM

## 2014-08-20 NOTE — Progress Notes (Signed)
Adult Psychoeducational Group Note  Date:  08/20/2014 Time:  2:27 PM  Group Topic/Focus:  Therapeutic Activity   Participation Level:  Active  Participation Quality:  Appropriate, Sharing and Supportive  Affect:  Excited  Cognitive:  Appropriate  Insight: Appropriate  Engagement in Group:  Engaged and Supportive  Modes of Intervention:  Activity, Discussion, Socialization and Support   [name] 08/20/2014, 2:27 PM

## 2014-08-20 NOTE — Progress Notes (Signed)
D.  Pt pleasant on approach, states that she is tired and wishes to go to bed early.  Pt denies SI/HI/hallucinations at this time.  Interacting appropriately with peers on the unit.   A.  Support and encouragement offered  R.  Will continue to monitor, remains safe on the unit.

## 2014-08-20 NOTE — [facility] Group Notes (Signed)
[facility] LCSW Group Therapy  08/20/2014 12:24 PM  Type of Therapy:  Group Therapy  Participation Level:  Active  Participation Quality:  Sharing  Affect:  Appropriate  Cognitive:  Alert and Oriented  Insight:  Developing/Improving  Engagement in Therapy:  Engaged  Modes of Intervention:  Discussion  Summary of Progress/Problems: Patient participated in group today during which the discussion was about coping strategies. In group we discussed what are negative coping strategies and how we have developed them over the years. Then processed examples of positive coping mechanisms.  The group then processed how to use positive attributes in order to develop their copingmechanisms. Group proceded through discussion and open dialogue.   Christene Lye 08/20/2014, 12:24 PM

## 2014-08-20 NOTE — Progress Notes (Signed)
D: Pt denies SI/HI/AVH. Pt is pleasant and cooperative. Pt focused on L-leg and pain medication. Pt avoids Probation officer and appears to minimize anything else going on with her.   A: Pt was offered support and encouragement. Pt was given scheduled medications. Pt was encourage to attend groups. Q 15 minute checks were done for safety.   R:Pt attends groups and interacts well with peers and staff. Pt is taking medication. Pt receptive to treatment and safety maintained on unit.

## 2014-08-21 DIAGNOSIS — F3130 Bipolar disorder, current episode depressed, mild or moderate severity, unspecified: Secondary | ICD-10-CM

## 2014-08-21 NOTE — [facility] Group Notes (Signed)
[facility] LCSW Group Therapy  08/21/2014 2:22 PM  Type of Therapy:  Group Therapy  Participation Level:  Minimal  Participation Quality:  Appropriate  Affect:  Appropriate  Cognitive:  Appropriate  Insight:  Limited  Engagement in Therapy:  Limited  Modes of Intervention:  Discussion  Summary of Progress/Problems:Group topic was about developing strong support systems.  This group began by discussing the difference between supports and enablers.  Group was able to clearly define supports as positive and enablers as negative.  With some time the group could further identify that the difference between enablers and supporters is the patient. Group empowered patient to see themselves as the leader of their own team and empowered to create appropriate supports from those who had previously been enablers.   Christene Lye 08/21/2014, 2:22 PM

## 2014-08-21 NOTE — Progress Notes (Addendum)
Patient ID: [name], female   DOB: 1996/02/23, 18 y.o.   MRN: [id number] Patient ID: [name], female   DOB: October 18, 1995, 18 y.o.   MRN: [id number] [facility] MD Progress Note  08/21/2014 1:37 PM [name]  MRN:  [id number] Subjective:   Patient states "My mood is better. I always feel a bit hyper.  My foot is still tender.  My mom will get me appt to have it looked at.  I slept hard last night, slept from 8:30 pm till breakfast this am". "I have to take Vistaril or else I just become angry sometimes for no apparent reason."   Objective:  Patient is assessed in her room.  She states that her lefty ankle is still painful.  There is slight evidence of edema.  No redness noted.  Patient able to walk and tolerate active/passive ROM.  Nursing staff report attendance in group therapy.  Patient is compliant with medications and denies any adverse effects. Assessed left ankle at her request. No obvious swelling or bruising noted. Some mild pain with full ROM of the joint.  Her current Lithium level drawn on 08/18/14 of less than 0.25 indicates medication noncompliance prior to admission.  Patient has Vistaril   Diagnosis:  Bipolar Disorder II by history, currently depressed, PTSD   Total Time spent with patient: 20 minutes  ADL's: Intact   Sleep: good- does report PTSD associated nightmares  Appetite:  Good  Suicidal Ideation:  Denies any suicidal ideations or thoughts of cutting at this time Homicidal Ideation:  Denies  AEB (as evidenced by):  Psychiatric Specialty Exam: Physical Exam  Vitals reviewed.   Review of Systems  Constitutional: Negative for fever, chills, weight loss, malaise/fatigue and diaphoresis.  HENT: Negative for congestion, ear discharge, ear pain, hearing loss, nosebleeds and tinnitus.   Eyes: Negative for blurred vision, double vision, photophobia, pain and discharge.  Respiratory: Negative for cough, hemoptysis, sputum production, shortness of breath and  wheezing.   Cardiovascular: Negative for chest pain, palpitations, orthopnea, claudication and leg swelling.  Gastrointestinal: Negative for heartburn, nausea, vomiting, abdominal pain, diarrhea, constipation and blood in stool.  Genitourinary: Negative for dysuria, urgency and frequency.  Musculoskeletal: Positive for joint pain (Left ankle pain). Negative for myalgias.  Skin: Negative.  Negative for itching and rash.       Denies any bleeding or bruising ( recent ASA overdose)   Neurological: Negative for dizziness, seizures, weakness and headaches.  Endo/Heme/Allergies: Negative for environmental allergies and polydipsia. Does not bruise/bleed easily.  Psychiatric/Behavioral: Positive for depression. The patient is nervous/anxious.     Blood pressure 121/86, pulse 131, temperature 98.4 F (36.9 C), temperature source Oral, resp. rate 18, height 5\' 3"  (1.6 m), weight 89.812 kg (198 lb), last menstrual period 08/09/2014.Body mass index is 35.08 kg/(m^2).  General Appearance: Casual  Eye Contact::  Good  Speech:  Normal Rate  Volume:  Normal  Mood:  Anxious  Affect:  Congruent  Thought Process:  Goal Directed and Linear  Orientation:  Full (Time, Place, and Person)  Thought Content:  denies hallucinations , no delusions  Suicidal Thoughts:  No at this time denies any thoughts of hurting self and  contracts for safety on unit   Homicidal Thoughts:  No  Memory:  Recent and Remote grossly intact  Judgement:  Fair  Insight:  Fair  Psychomotor Activity:  Normal  Concentration:  Good  Recall:  Good  Fund of Knowledge:Good  Language: Good  Akathisia:  Negative  Handed:  Right  AIMS (if indicated):     Assets:  Communication Skills Desire for Improvement Resilience Social Support  Sleep:  Number of Hours: 6.75   Musculoskeletal: Strength & Muscle Tone: within normal limits Gait & Station: normal Patient leans: N/A  Current Medications: Current Facility-Administered Medications   Medication Dose Route Frequency Provider Last Rate Last Dose  . acetaminophen (TYLENOL) tablet 650 mg  650 mg Oral Q6H PRN Laverle Hobby, PA-C   650 mg at 08/18/14 1253  . alum & mag hydroxide-simeth (MAALOX/MYLANTA) 200-200-20 MG/5ML suspension 30 mL  30 mL Oral Q4H PRN Laverle Hobby, PA-C      . cloNIDine HCl (KAPVAY) ER tablet 0.1 mg  0.1 mg Oral BID Laverle Hobby, PA-C   0.1 mg at 08/21/14 0800  . escitalopram (LEXAPRO) tablet 20 mg  20 mg Oral Daily Laverle Hobby, PA-C   20 mg at 08/21/14 0843  . hydrOXYzine (ATARAX/VISTARIL) tablet 25 mg  25 mg Oral Q6H PRN Laverle Hobby, PA-C   25 mg at 08/21/14 1329  . ibuprofen (ADVIL,MOTRIN) tablet 600 mg  600 mg Oral Q6H PRN Elmarie Shiley, NP      . lamoTRIgine (LAMICTAL) tablet 100 mg  100 mg Oral Daily Laverle Hobby, PA-C   100 mg at 08/21/14 9379  . levonorgestrel-ethinyl estradiol (AVIANE,ALESSE,LESSINA) 0.1-20 MG-MCG per tablet 1 tablet  1 tablet Oral Daily Laverle Hobby, PA-C   1 tablet at 08/21/14 0800  . levothyroxine (SYNTHROID, LEVOTHROID) tablet 150 mcg  150 mcg Oral QAC breakfast Laverle Hobby, PA-C   150 mcg at 08/21/14 0240  . lithium carbonate capsule 300 mg  300 mg Oral q morning - 10a Myer Peer Cobos, MD   300 mg at 08/21/14 1013  . lithium carbonate capsule 600 mg  600 mg Oral QHS Myer Peer Cobos, MD   600 mg at 08/20/14 2101  . lurasidone (LATUDA) tablet 20 mg  20 mg Oral QHS Jenne Campus, MD   20 mg at 08/20/14 2102  . magnesium hydroxide (MILK OF MAGNESIA) suspension 30 mL  30 mL Oral Daily PRN Laverle Hobby, PA-C      . methylphenidate (CONCERTA) CR tablet 18 mg  18 mg Oral Daily Laverle Hobby, PA-C   18 mg at 08/21/14 0843  . nicotine (NICODERM CQ - dosed in mg/24 hours) patch 21 mg  21 mg Transdermal Daily Laverle Hobby, PA-C   21 mg at 08/21/14 0843  . traZODone (DESYREL) tablet 50 mg  50 mg Oral QHS,MR X 1 Laverle Hobby, PA-C   50 mg at 08/20/14 2101    Lab Results:  Results for orders placed or  performed during the hospital encounter of 08/17/14 (from the past 48 hour(s))  GC/Chlamydia Probe Amp     Status: None   Collection Time: 08/19/14  3:02 PM  Result Value Ref Range   CT Probe RNA NEGATIVE NEGATIVE   GC Probe RNA NEGATIVE NEGATIVE    Comment: (NOTE)                                                                                       **  Normal Reference Range: Negative**      Assay performed using the Gen-Probe APTIMA COMBO2 (R) Assay. Acceptable specimen types for this assay include APTIMA Swabs (Unisex, endocervical, urethral, or vaginal), first void urine, and ThinPrep liquid based cytology samples. Performed at [facility]   Hepatitis C antibody     Status: None   Collection Time: 08/19/14  7:50 PM  Result Value Ref Range   HCV Ab NEGATIVE NEGATIVE    Comment: Performed at [facility]  RPR     Status: None   Collection Time: 08/20/14  6:50 AM  Result Value Ref Range   RPR NON REAC NON REAC    Comment: Performed at [facility]  HIV antibody     Status: None   Collection Time: 08/20/14  6:50 AM  Result Value Ref Range   HIV 1&2 Ab, 4th Generation NONREACTIVE NONREACTIVE    Comment: (NOTE) A NONREACTIVE HIV Ag/Ab result does not exclude HIV infection since the time frame for seroconversion is variable. If acute HIV infection is suspected, a HIV-1 RNA Qualitative TMA test is recommended. HIV-1/2 Antibody Diff         Not indicated. HIV-1 RNA, Qual TMA           Not indicated. PLEASE NOTE: This information has been disclosed to you from records whose confidentiality may be protected by state law. If your state requires such protection, then the state law prohibits you from making any further disclosure of the information without the specific written consent of the person to whom it pertains, or as otherwise permitted by law. A general authorization for the release of medical or other information is NOT sufficient for this purpose. The  performance of this assay has not been clinically validated in patients less than 75 years old. Performed at [facility]     Physical Findings: AIMS: Facial and Oral Movements Muscles of Facial Expression: None, normal Lips and Perioral Area: None, normal Jaw: None, normal Tongue: None, normal,Extremity Movements Upper (arms, wrists, hands, fingers): None, normal Lower (legs, knees, ankles, toes): None, normal, Trunk Movements Neck, shoulders, hips: None, normal, Overall Severity Severity of abnormal movements (highest score from questions above): None, normal Incapacitation due to abnormal movements: None, normal Patient's awareness of abnormal movements (rate only patient's report): No Awareness, Dental Status Current problems with teeth and/or dentures?: No Does patient usually wear dentures?: No  CIWA:    COWS:     Treatment Plan Summary: Daily contact with patient to assess and evaluate symptoms and progress in treatment Medication management See below  Plan: 1. Continue inpatient treatment , milieu, support 2. Continue Lamictal 100 mgrs QDAY for improved mood control.  3. Continue Lexapro 20 mgrs QDAY for depression 4. Continue Latuda now at 20 mgrs QDAY ( being tapered down) 5. Continue LiCO3 at 300 mgrs QAM and 600 mgrs QHS for improved mood control.  6. Address health issues: Vitals reviewed and stable. Order motrin 600 mg every six hours for complaints of left ankle pain.   Medical Decision Making Problem Points:  Established problem, stable/improving (1), Review of last therapy session (1) and Review of psycho-social stressors (1) Data Points:  Review or order clinical lab tests (1) Review of medication regiment & side effects (2) Review of new medications or change in dosage (2)  I certify that inpatient services furnished can reasonably be expected to improve the patient's condition.   Kerrie Buffalo AGNP-BC 08/21/2014, 1:37 PM

## 2014-08-21 NOTE — Progress Notes (Signed)
Patient is up and around milieu, social with peers and staff. Ratings depression, anxiety and hopelessness as a 0/0. Reports poor sleep last night and states she is groggy this morning. Encouraged to speak with provider regarding this concern. Medicated per orders and education provided. Support offered. Patient states her goal for today is discharge planning and she is anxious to speak with SW. She denies SI/HI and remains safe. Jamie Kato

## 2014-08-21 NOTE — Progress Notes (Signed)
Pt attended NA group this evening.  

## 2014-08-21 NOTE — Progress Notes (Signed)
Patient has been up and visible throughout the day. Attended 2/3 groups. Labile at times and voicing frequent needs. Came to this Probation officer at [employer] stating, "I feel like I could put my hand through a wall. Nothing has happened. I just feel angry and agitated all of a sudden." Vistaril given along with emotional support. Patient was able to contract for safety and was able to calm down. Pt safe, presently in cafeteria for dinner. Jamie Kato

## 2014-08-21 NOTE — [facility] Group Notes (Signed)
[facility] Group Notes:  (Nursing/MHT/Case Management/Adjunct)  Date:  08/21/2014  Time:  9:00am  Type of Therapy:  Nurse Education  Participation Level:  Active  Participation Quality:  Appropriate  Affect:  Appropriate  Cognitive:  Appropriate  Insight:  Good  Engagement in Group:  Developing/Improving  Modes of Intervention:  Clarification and Discussion  Summary of Progress/Problems: Rules of the unit were reviewed along with treatment agreement signed on admit. Questions were answered and concerns received. Loletta Specter [facility] 08/21/2014, 10:46 AM

## 2014-08-21 NOTE — Plan of Care (Signed)
Problem: Ineffective individual coping Goal: STG-Increase in ability to manage activities of daily living Outcome: Progressing Pt up, dressed and hygiene completed.  Problem: Diagnosis: Increased Risk For Suicide Attempt Goal: STG-Patient Will Comply With Medication Regime Outcome: Progressing Patient has been med compliant.

## 2014-08-21 NOTE — [facility] Group Notes (Signed)
[facility] Group Notes:  (Nursing/MHT/Case Management/Adjunct)  Date:  08/21/2014  Time:  3:29 PM  Type of Therapy:  Nurse Education  Participation Level:  Did Not Attend  Participation Quality:  Did not attend  Affect:  Did not attend  Cognitive:  Did not attend  Insight:  None  Engagement in Group:  None  Modes of Intervention:  Discussion and Education  Summary of Progress/Problems: The purpose of this group is to follow up on earlier expressed concerns and rules. Patient was invited but did not attend.  Gaylan Gerold E 08/21/2014, 3:29 PM

## 2014-08-21 NOTE — Progress Notes (Signed)
D   Pt reports feeling good this evening and looking forward to discharge tomorrow  She interacts well with others and has been compliant with medications and groups  A   Verbal support and encouragement given   Medications administered and effectiveness monitored   Q 15 min checks R   Pt safe at present and receptive to verbal support

## 2014-08-22 DIAGNOSIS — F3130 Bipolar disorder, current episode depressed, mild or moderate severity, unspecified: Secondary | ICD-10-CM | POA: Insufficient documentation

## 2014-08-22 MED ORDER — HYDROXYZINE HCL 25 MG PO TABS
25.0000 mg | ORAL_TABLET | Freq: Two times a day (BID) | ORAL | Status: DC
  Filled 2014-08-22 (×4): qty 28

## 2014-08-22 MED ORDER — LEVONORGESTREL-ETHINYL ESTRAD 0.1-20 MG-MCG PO TABS: 1.0000 | ORAL_TABLET | Freq: Every day | ORAL | Status: DC

## 2014-08-22 MED ORDER — LITHIUM CARBONATE 300 MG PO CAPS: 300.0000 mg | ORAL_CAPSULE | Freq: Every morning | ORAL | Status: DC

## 2014-08-22 MED ORDER — TRAZODONE HCL 50 MG PO TABS: 50.0000 mg | ORAL_TABLET | Freq: Every evening | ORAL | Status: DC | PRN

## 2014-08-22 MED ORDER — LURASIDONE HCL 20 MG PO TABS: 20.0000 mg | ORAL_TABLET | Freq: Every day | ORAL | Status: DC

## 2014-08-22 MED ORDER — ESCITALOPRAM OXALATE 20 MG PO TABS: 20.0000 mg | ORAL_TABLET | Freq: Every day | ORAL | Status: DC

## 2014-08-22 MED ORDER — LITHIUM CARBONATE 600 MG PO CAPS
600.0000 mg | ORAL_CAPSULE | Freq: Every day | ORAL | Status: DC
Start: 2014-08-22 — End: 2016-10-30

## 2014-08-22 MED ORDER — METHYLPHENIDATE HCL ER (OSM) 18 MG PO TBCR: 18.0000 mg | EXTENDED_RELEASE_TABLET | Freq: Every day | ORAL | Status: DC

## 2014-08-22 MED ORDER — LAMOTRIGINE 100 MG PO TABS: 100.0000 mg | ORAL_TABLET | Freq: Every day | ORAL | Status: DC

## 2014-08-22 MED ORDER — HYDROXYZINE HCL 25 MG PO TABS: 25.0000 mg | ORAL_TABLET | Freq: Two times a day (BID) | ORAL | Status: DC

## 2014-08-22 MED ORDER — ACETAMINOPHEN-CODEINE #3 300-30 MG PO TABS: 1.0000 | ORAL_TABLET | ORAL | Status: DC | PRN

## 2014-08-22 NOTE — Progress Notes (Signed)
[facility] Adult Case Management Discharge Plan :  Will you be returning to the same living situation after discharge: Yes,  patient will return home with her parents At discharge, do you have transportation home?:Yes,  patient's parents will provide transportation Do you have the ability to pay for your medications:Yes,  patient will be provided with prescriptions at discharge.  Release of information consent forms completed and in the chart;  Patient's signature needed at discharge.  Patient to Follow up at: Follow-up Information    Follow up with [facility] On 09/01/2014.   Why:  Therapy appointment with Carlota Raspberry on Thursday Dec. 3rd at 6 pm. Please call office if you need to reschedule.   Contact information:   [address] [place], [place] [address] Phone [phone]; Fax [phone]      Follow up with [facility] On 08/30/2014.   Why:  Medication management appointment on Tuesday Dec. 1st at 1:30 pm. Please call office if you need to reschedule.   Contact information:   [place], [place] [address] Phone [phone]; Fax [phone]      Patient denies SI/HI:   Yes,  denies    Safety Planning and Suicide Prevention discussed:  Yes,  with patient and father  Richardo Priest 08/22/2014, 10:33 AM

## 2014-08-22 NOTE — [facility] Group Notes (Signed)
[facility] LCSW Aftercare Discharge Planning Group Note  08/22/2014  8:45 AM  Participation Quality: Did Not Attend- patient in bed.  Aviraj Kentner, MSW, LCSWA Clinical Social Worker [facility] [phone]   
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
## 2014-08-22 NOTE — [facility] Group Notes (Signed)
[facility] LCSW Group Therapy 08/22/2014  1:15 PM   Type of Therapy: Group Therapy  Participation Level: Did Not Attend for unknown reason.   Tilden Fossa, MSW, [place] Worker [facility] [phone]

## 2014-08-22 NOTE — Clinical Social Work Note (Signed)
CSW met with patient who reports that she feels ready for discharge. She denies any SI/HI at this time. CSW provided patient with information regarding her upcoming outpatient appointments at [facility]. Patient has no further questions at this time.  Tilden Fossa, MSW, [place] Worker [facility] [phone]
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
## 2014-08-24 MED ORDER — METHYLPHENIDATE HCL ER (OSM) 18 MG PO TBCR: 18.0000 mg | EXTENDED_RELEASE_TABLET | Freq: Every day | ORAL | Status: DC

## 2014-08-24 NOTE — Progress Notes (Signed)
Patient Discharge Instructions:  After Visit Summary (AVS):   Faxed to:  08/24/14 Discharge Summary Note:   Faxed to:  08/24/14 Psychiatric Admission Assessment Note:   Faxed to:  08/24/14 Suicide Risk Assessment - Discharge Assessment:   Faxed to:  08/24/14 Faxed/Sent to the Next Level Care provider:  08/24/14 Faxed to [facility] @ [phone]  Patsey Berthold, 08/24/2014, 2:27 PM

## 2014-12-03 ENCOUNTER — Ambulatory Visit (INDEPENDENT_AMBULATORY_CARE_PROVIDER_SITE_OTHER): Payer: BC Managed Care – PPO | Admitting: Family Medicine

## 2014-12-03 VITALS — BP 120/90 | HR 82 | Temp 98.1°F | Ht 63.5 in | Wt 203.0 lb

## 2014-12-03 DIAGNOSIS — F489 Nonpsychotic mental disorder, unspecified: Secondary | ICD-10-CM | POA: Diagnosis not present

## 2014-12-03 DIAGNOSIS — F32A Depression, unspecified: Secondary | ICD-10-CM

## 2014-12-03 DIAGNOSIS — Z7289 Other problems related to lifestyle: Secondary | ICD-10-CM

## 2014-12-03 DIAGNOSIS — F329 Major depressive disorder, single episode, unspecified: Secondary | ICD-10-CM

## 2014-12-03 DIAGNOSIS — S51802A Unspecified open wound of left forearm, initial encounter: Secondary | ICD-10-CM | POA: Diagnosis not present

## 2014-12-03 NOTE — Progress Notes (Signed)
Patient ID: [name], female   DOB: 10/22/1995, 19 y.o.   MRN: [id number]  Verbal consent obtained from patient.  Local anesthesia with 10cc Lidocaine 2% with epinephrine.  Wound scrubbed with soap and water and rinsed.  Wound closed with #14 (#8 SI, #6 HM) 4-0 Prolene sutures.  Wound cleansed and dressed.

## 2014-12-03 NOTE — Patient Instructions (Signed)
Suture removal in 7-10 days  Return if any concern of infection of wound  Continue to follow-up closely with your therapist and psychiatrist.  In the event of suicidal thoughts being dwelled upon, reach out immediately for help to your parents, therapists, emergency room, here, or other support structure.  Continue your current psychiatric medications.
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
## 2014-12-05 ENCOUNTER — Emergency Department (HOSPITAL_COMMUNITY)
Admission: EM | Admit: 2014-12-05 | Discharge: 2014-12-06 | Disposition: A | Discharge status: Home / Self Care | Payer: BC Managed Care – PPO | Attending: Emergency Medicine | Admitting: Emergency Medicine

## 2014-12-05 ENCOUNTER — Encounter (HOSPITAL_COMMUNITY): Payer: Self-pay | Admitting: Emergency Medicine

## 2014-12-05 DIAGNOSIS — Y9289 Other specified places as the place of occurrence of the external cause: Secondary | ICD-10-CM | POA: Insufficient documentation

## 2014-12-05 DIAGNOSIS — X789XXA Intentional self-harm by unspecified sharp object, initial encounter: Secondary | ICD-10-CM | POA: Diagnosis not present

## 2014-12-05 DIAGNOSIS — Y9389 Activity, other specified: Secondary | ICD-10-CM | POA: Insufficient documentation

## 2014-12-05 DIAGNOSIS — E669 Obesity, unspecified: Secondary | ICD-10-CM | POA: Insufficient documentation

## 2014-12-05 DIAGNOSIS — IMO0002 Reserved for concepts with insufficient information to code with codable children: Secondary | ICD-10-CM

## 2014-12-05 DIAGNOSIS — R45851 Suicidal ideations: Secondary | ICD-10-CM | POA: Insufficient documentation

## 2014-12-05 DIAGNOSIS — F32A Depression, unspecified: Secondary | ICD-10-CM

## 2014-12-05 DIAGNOSIS — Y998 Other external cause status: Secondary | ICD-10-CM | POA: Diagnosis not present

## 2014-12-05 DIAGNOSIS — S51812A Laceration without foreign body of left forearm, initial encounter: Secondary | ICD-10-CM

## 2014-12-05 DIAGNOSIS — Z79899 Other long term (current) drug therapy: Secondary | ICD-10-CM | POA: Insufficient documentation

## 2014-12-05 DIAGNOSIS — F329 Major depressive disorder, single episode, unspecified: Secondary | ICD-10-CM

## 2014-12-05 DIAGNOSIS — Z7289 Other problems related to lifestyle: Secondary | ICD-10-CM

## 2014-12-05 DIAGNOSIS — Z720 Tobacco use: Secondary | ICD-10-CM | POA: Diagnosis not present

## 2014-12-05 MED ORDER — LIDOCAINE HCL 2 % IJ SOLN
INTRAMUSCULAR | Status: AC
  Filled 2014-12-05: qty 20

## 2014-12-05 MED ORDER — LIDOCAINE-EPINEPHRINE (PF) 2 %-1:200000 IJ SOLN: 10.0000 mL | Freq: Once | INTRAMUSCULAR | Status: DC

## 2014-12-05 NOTE — [facility] Counselor (Signed)
EDP Claiborne Billings [place], [place]) called TTS Counselor and shared some hx and collateral info about pt. Counselor also reviewed pt's chart and physician's notes in preparation for TTS assessment. EDP does not feel that pt is imminent risk to self or others and suggested possible no-harm contract as an option for disposition.  Assessment to begin shortly.   Ramond Dial, [facility] Triage Specialist
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
## 2014-12-05 NOTE — BHH Counselor (Signed)
Pt does not meet inpatient criteria. Pt willing to sign no-harm contract and agrees to come back to ED if she begins to experience any suicidal ideation or urges to self-harm.  Pt's mother and pt's EDP Antonietta Breach, [place]) are in agreement with disposition.   Ramond Dial, [facility] Triage Specialist
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
## 2014-12-06 MED ORDER — IBUPROFEN 800 MG PO TABS: 800.0000 mg | ORAL_TABLET | Freq: Once | ORAL | Status: DC

## 2014-12-06 MED ORDER — BACITRACIN ZINC 500 UNIT/GM EX OINT: 1.0000 "application " | TOPICAL_OINTMENT | Freq: Two times a day (BID) | CUTANEOUS | Status: DC

## 2014-12-06 NOTE — Discharge Instructions (Signed)
Laceration Care, Adult A laceration is a cut or lesion that goes through all layers of the skin and into the tissue just beneath the skin. TREATMENT  Some lacerations may not require closure. Some lacerations may not be able to be closed due to an increased risk of infection. It is important to see your caregiver as soon as possible after an injury to minimize the risk of infection and maximize the opportunity for successful closure. If closure is appropriate, pain medicines may be given, if needed. The wound will be cleaned to help prevent infection. Your caregiver will use stitches (sutures), staples, wound glue (adhesive), or skin adhesive strips to repair the laceration. These tools bring the skin edges together to allow for faster healing and a better cosmetic outcome. However, all wounds will heal with a scar. Once the wound has healed, scarring can be minimized by covering the wound with sunscreen during the day for 1 full year. HOME CARE INSTRUCTIONS  For sutures or staples: 1. Keep the wound clean and dry. 2. If you were given a bandage (dressing), you should change it at least once a day. Also, change the dressing if it becomes wet or dirty, or as directed by your caregiver. 3. Wash the wound with soap and water 2 times a day. Rinse the wound off with water to remove all soap. Pat the wound dry with a clean towel. 4. After cleaning, apply a thin layer of the antibiotic ointment as recommended by your caregiver. This will help prevent infection and keep the dressing from sticking. 5. You may shower as usual after the first 24 hours. Do not soak the wound in water until the sutures are removed. 6. Only take over-the-counter or prescription medicines for pain, discomfort, or fever as directed by your caregiver. 7. Get your sutures or staples removed as directed by your caregiver. For skin adhesive strips:  Keep the wound clean and dry.  Do not get the skin adhesive strips wet. You may bathe  carefully, using caution to keep the wound dry.  If the wound gets wet, pat it dry with a clean towel.  Skin adhesive strips will fall off on their own. You may trim the strips as the wound heals. Do not remove skin adhesive strips that are still stuck to the wound. They will fall off in time. For wound adhesive:  You may briefly wet your wound in the shower or bath. Do not soak or scrub the wound. Do not swim. Avoid periods of heavy perspiration until the skin adhesive has fallen off on its own. After showering or bathing, gently pat the wound dry with a clean towel.  Do not apply liquid medicine, cream medicine, or ointment medicine to your wound while the skin adhesive is in place. This may loosen the film before your wound is healed.  If a dressing is placed over the wound, be careful not to apply tape directly over the skin adhesive. This may cause the adhesive to be pulled off before the wound is healed.  Avoid prolonged exposure to sunlight or tanning lamps while the skin adhesive is in place. Exposure to ultraviolet light in the first year will darken the scar.  The skin adhesive will usually remain in place for 5 to 10 days, then naturally fall off the skin. Do not pick at the adhesive film. You may need a tetanus shot if:  You cannot remember when you had your last tetanus shot.  You have never had a tetanus  shot. If you get a tetanus shot, your arm may swell, get red, and feel warm to the touch. This is common and not a problem. If you need a tetanus shot and you choose not to have one, there is a rare chance of getting tetanus. Sickness from tetanus can be serious. SEEK MEDICAL CARE IF:   You have redness, swelling, or increasing pain in the wound.  You see a red line that goes away from the wound.  You have yellowish-white fluid (pus) coming from the wound.  You have a fever.  You notice a bad smell coming from the wound or dressing.  Your wound breaks open before or  after sutures have been removed.  You notice something coming out of the wound such as wood or glass.  Your wound is on your hand or foot and you cannot move a finger or toe. SEEK IMMEDIATE MEDICAL CARE IF:   Your pain is not controlled with prescribed medicine.  You have severe swelling around the wound causing pain and numbness or a change in color in your arm, hand, leg, or foot.  Your wound splits open and starts bleeding.  You have worsening numbness, weakness, or loss of function of any joint around or beyond the wound.  You develop painful lumps near the wound or on the skin anywhere on your body. MAKE SURE YOU:   Understand these instructions.  Will watch your condition.  Will get help right away if you are not doing well or get worse. Document Released: 09/16/2005 Document Revised: 12/09/2011 Document Reviewed: 03/12/2011 [facility] Patient Information 2015 [place], [place]. This information is not intended to replace advice given to you by your health care provider. Make sure you discuss any questions you have with your health care provider.  Depression Depression refers to feeling sad, low, down in the dumps, blue, gloomy, or empty. In general, there are two kinds of depression: 8. Normal sadness or normal grief. This kind of depression is one that we all feel from time to time after upsetting life experiences, such as the loss of a job or the ending of a relationship. This kind of depression is considered normal, is short lived, and resolves within a few days to 2 weeks. Depression experienced after the loss of a loved one (bereavement) often lasts longer than 2 weeks but normally gets better with time. 9. Clinical depression. This kind of depression lasts longer than normal sadness or normal grief or interferes with your ability to function at home, at work, and in school. It also interferes with your personal relationships. It affects almost every aspect of your life. Clinical  depression is an illness. Symptoms of depression can also be caused by conditions other than those mentioned above, such as:  Physical illness. Some physical illnesses, including underactive thyroid gland (hypothyroidism), severe anemia, specific types of cancer, diabetes, uncontrolled seizures, heart and lung problems, strokes, and chronic pain are commonly associated with symptoms of depression.  Side effects of some prescription medicine. In some people, certain types of medicine can cause symptoms of depression.  Substance abuse. Abuse of alcohol and illicit drugs can cause symptoms of depression. SYMPTOMS Symptoms of normal sadness and normal grief include the following:  Feeling sad or crying for short periods of time.  Not caring about anything (apathy).  Difficulty sleeping or sleeping too much.  No longer able to enjoy the things you used to enjoy.  Desire to be by oneself all the time (social isolation).  Lack  of energy or motivation.  Difficulty concentrating or remembering.  Change in appetite or weight.  Restlessness or agitation. Symptoms of clinical depression include the same symptoms of normal sadness or normal grief and also the following symptoms:  Feeling sad or crying all the time.  Feelings of guilt or worthlessness.  Feelings of hopelessness or helplessness.  Thoughts of suicide or the desire to harm yourself (suicidal ideation).  Loss of touch with reality (psychotic symptoms). Seeing or hearing things that are not real (hallucinations) or having false beliefs about your life or the people around you (delusions and paranoia). DIAGNOSIS  The diagnosis of clinical depression is usually based on how bad the symptoms are and how long they have lasted. Your health care provider will also ask you questions about your medical history and substance use to find out if physical illness, use of prescription medicine, or substance abuse is causing your depression.  Your health care provider may also order blood tests. TREATMENT  Often, normal sadness and normal grief do not require treatment. However, sometimes antidepressant medicine is given for bereavement to ease the depressive symptoms until they resolve. The treatment for clinical depression depends on how bad the symptoms are but often includes antidepressant medicine, counseling with a mental health professional, or both. Your health care provider will help to determine what treatment is best for you. Depression caused by physical illness usually goes away with appropriate medical treatment of the illness. If prescription medicine is causing depression, talk with your health care provider about stopping the medicine, decreasing the dose, or changing to another medicine. Depression caused by the abuse of alcohol or illicit drugs goes away when you stop using these substances. Some adults need professional help in order to stop drinking or using drugs. SEEK IMMEDIATE MEDICAL CARE IF:  You have thoughts about hurting yourself or others.  You lose touch with reality (have psychotic symptoms).  You are taking medicine for depression and have a serious side effect. FOR MORE INFORMATION  National Alliance on Mental Illness: www.nami.[employer]: https://carter.com/ Document Released: 09/13/2000 Document Revised: 01/31/2014 Document Reviewed: 12/16/2011 [facility] Patient Information 2015 [place], [place]. This information is not intended to replace advice given to you by your health care provider. Make sure you discuss any questions you have with your health care provider.

## 2015-03-21 ENCOUNTER — Ambulatory Visit (INDEPENDENT_AMBULATORY_CARE_PROVIDER_SITE_OTHER): Payer: BC Managed Care – PPO

## 2015-03-21 ENCOUNTER — Ambulatory Visit (INDEPENDENT_AMBULATORY_CARE_PROVIDER_SITE_OTHER): Payer: BC Managed Care – PPO | Admitting: Family Medicine

## 2015-03-21 VITALS — BP 122/72 | HR 79 | Temp 98.8°F | Resp 17 | Ht 64.5 in | Wt 202.0 lb

## 2015-03-21 DIAGNOSIS — M25532 Pain in left wrist: Secondary | ICD-10-CM

## 2015-03-21 DIAGNOSIS — T148 Other injury of unspecified body region: Secondary | ICD-10-CM | POA: Diagnosis not present

## 2015-03-21 DIAGNOSIS — M79642 Pain in left hand: Secondary | ICD-10-CM

## 2015-03-21 DIAGNOSIS — T148XXA Other injury of unspecified body region, initial encounter: Secondary | ICD-10-CM
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
## 2015-03-30 ENCOUNTER — Ambulatory Visit (INDEPENDENT_AMBULATORY_CARE_PROVIDER_SITE_OTHER): Payer: BC Managed Care – PPO | Admitting: Emergency Medicine

## 2015-03-30 VITALS — BP 120/82 | HR 64 | Temp 98.6°F | Resp 12 | Ht 64.25 in | Wt 197.0 lb

## 2015-03-30 DIAGNOSIS — S61512A Laceration without foreign body of left wrist, initial encounter: Secondary | ICD-10-CM

## 2015-03-30 DIAGNOSIS — F489 Nonpsychotic mental disorder, unspecified: Secondary | ICD-10-CM

## 2015-03-30 DIAGNOSIS — Z7289 Other problems related to lifestyle: Secondary | ICD-10-CM

## 2015-03-30 NOTE — Progress Notes (Signed)
Procedure:  Consent obtained.  Local anesthesia with 1% lido with epi.  Anesthesia obtained.  Wound cleaned with sterile water and closed with 5-0 prolene #6 horizontal mattress.  Drsg placed.  Wound care d/w pt.
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
## 2015-04-24 ENCOUNTER — Ambulatory Visit (INDEPENDENT_AMBULATORY_CARE_PROVIDER_SITE_OTHER): Payer: BC Managed Care – PPO | Admitting: Physician Assistant

## 2015-04-24 ENCOUNTER — Ambulatory Visit (INDEPENDENT_AMBULATORY_CARE_PROVIDER_SITE_OTHER): Payer: BC Managed Care – PPO

## 2015-04-24 VITALS — BP 112/68 | HR 107 | Temp 97.9°F | Resp 18 | Ht 64.0 in | Wt 195.0 lb

## 2015-04-24 DIAGNOSIS — M79672 Pain in left foot: Secondary | ICD-10-CM

## 2015-04-24 DIAGNOSIS — S99922A Unspecified injury of left foot, initial encounter: Secondary | ICD-10-CM

## 2015-04-24 MED ORDER — TRAMADOL HCL 50 MG PO TABS: 50.0000 mg | ORAL_TABLET | Freq: Three times a day (TID) | ORAL | Status: DC | PRN

## 2015-04-24 NOTE — Progress Notes (Addendum)
   Subjective:    Patient ID: [name], female    DOB: 07/19/1996, 19 y.o.   MRN: [id number]  HPI Patient presents for left lateral foot pain following several days of increased walking. Has hiked over 50 miles then [place] over the past 4-5 days. Has been wear gym shoes without good support. Painful from bottom of ankle to pinky toe along the side. Has some tingling with walking. Recalls also rolling ankle this weekend, but continued to hike. Put ice on foot and took tylenol without any relief. Denies past foot trauma or surgeries. Denies weakness or numbness. No change in ROM, however, hurts to move in most directions. Foot was swollen, but has resolved. Area of pain has bruise.  Med allergy: Xanax. Cannot take ibuprofen due to affect on thyroid.   Review of Systems  Constitutional: Negative.   Musculoskeletal: Positive for gait problem. Negative for myalgias, back pain, joint swelling and arthralgias.  Skin: Positive for color change (bruise).  Neurological: Negative for weakness and numbness.       Objective:   Physical Exam  Constitutional: She is oriented to person, place, and time. She appears well-developed and well-nourished. No distress.  Blood pressure 112/68, pulse 107, temperature 97.9 F (36.6 C), temperature source Oral, resp. rate 18, height 5\' 4"  (1.626 m), weight 195 lb (88.451 kg), last menstrual period 04/17/2015, SpO2 98 %.  HENT:  Head: Normocephalic and atraumatic.  Right Ear: External ear normal.  Left Ear: External ear normal.  Eyes: Conjunctivae are normal. Right eye exhibits no discharge. Left eye exhibits no discharge. No scleral icterus.  Pulmonary/Chest: Effort normal.  Musculoskeletal: Normal range of motion. She exhibits tenderness. She exhibits no edema.       Left ankle: Normal.       Left lower leg: Normal.       Left foot: There is tenderness and bony tenderness. There is normal range of motion, no swelling, normal capillary refill, no  crepitus, no deformity and no laceration.       Feet:  Neurological: She is alert and oriented to person, place, and time. She has normal strength and normal reflexes. No cranial nerve deficit or sensory deficit. She exhibits normal muscle tone. Coordination normal.  Skin: Skin is warm, dry and intact. No rash noted. She is not diaphoretic. No erythema. No pallor.  Psychiatric: She has a normal mood and affect. Her behavior is normal. Judgment and thought content normal.   [facility] reading (PRIMARY) by  Dr. Everlene Farrier. No acute bony abnormality.     Assessment & Plan:  1. Foot injury, left, initial encounter 2. Left foot pain Possible stress fracture. RTC 05/08/15 to re-X-ray foot. Should wear cam-walker until seen again. Ice foot 3-4x/day for next 48 hrs or with additional pain. Pros and cons of medication for pain discussed. - DG Foot Complete Left; Future - traMADol (ULTRAM) 50 MG tablet; Take 1 tablet (50 mg total) by mouth every 8 (eight) hours as needed.  Dispense: 30 tablet; Refill: 0   Jaylene Schrom PA-C  [facility] 04/24/2015 9:19 AM

## 2015-04-24 NOTE — Patient Instructions (Signed)
Stress Fracture When too much stress is put on the foot, as may occur in running and jumping sports, the lengthy shafts of the bones of the forefoot become susceptible to breaking due to repetitive stress (stress fracture) because of thinness of these bone. A stress fracture is more common if osteoporosis is present or if inadequate athletic footwear is used. Shoes should be used which adequately support the sole of the foot to absorb the shocks of the activity participated in. Stress fractures are very common in competitive female runners who develop these small cracks on the surface of the bones in their legs and feet. The women most likely to suffer these injuries are those who restrict food intake and those who have irregular periods. Stress fractures usually start out as a minor discomfort in the foot or leg. The completion of fracture due to repetitive loading often occurs near the end of a long run. The pain may dissipate with rest. With the next exercise session, the pain may return earlier in the run. If an athlete notices that it hurts to touch just one spot on a bone and then stops running for a week, he or she may be tempted to return to running too soon. Often the pain is ignored in order to continue with high impact exercise. A stress fracture then develops. The athlete now has to avoid the hard pounding of running, but can ride a bike or swim for exercise once the pain has resolved with normal weight bearing until the fracture heals in 6-12 weeks. The most common sites for stress fractures are the bones in the front of the feet (metatarsals) and the long bone of the lower leg (tibia), but running can cause stress fractures anywhere in the lower extremities or pelvis. DIAGNOSIS  Usually the diagnosis is made by reviewing the patient's history. The bone involved progressively becomes more painful with activities. X-rays may show no break within the first 2-3 weeks that pain begins. A later X-ray may  show signs that the bone is healing. Having a bone scan or MRI usually makes an earlier diagnosis possible. HOME CARE INSTRUCTIONS  Treatment may include a cast or walking shoe.  High impact activities should be stopped until advised by your caregiver.  Wear shoes with adequate shock absorbing abilities and good support of the sole of the foot. This is especially important in the arch of the foot.  Alternative exercise may be undertaken while waiting for healing. This may include bicycling and swimming. If you do not have a cast or splint:  You may walk on your injured foot as tolerated or advised.  Do not put any weight on your injured foot until instructed. Slowly increase the amount of time you walk on the foot as the pain allows or as advised.  Use crutches until you can bear weight without pain. A gradual increase in weight bearing may help.  Apply ice to the injured area for the first 2 days after you have been treated or as directed by your caregiver.  Put ice in a plastic bag.  Place a towel between your skin and the bag.  Leave the ice on for 15-20 minutes at a time, every hour while you are awake.  Only take over-the-counter or prescription medicines for pain or discomfort as directed by your caregiver.  If your caregiver has given you a follow-up appointment, it is very important to keep that appointment. Not keeping the appointment could result in a chronic or permanent  injury, pain, and disability. SEEK IMMEDIATE MEDICAL CARE IF:   Pain is becoming worse rather than better.  Pain is uncontrolled with medicine.  You have increased swelling or redness in the foot.  The feeling in the foot or leg is diminished. MAKE SURE YOU:   Understand these instructions.  Will watch your condition.  Will get help right away if you are not doing well or get worse. Document Released: 12/07/2002 Document Revised: 01/11/2013 Document Reviewed: 05/02/2008 [facility] Patient  Information 2015 [place], [place]. This information is not intended to replace advice given to you by your health care provider. Make sure you discuss any questions you have with your health care provider.

## 2015-05-21 ENCOUNTER — Ambulatory Visit (INDEPENDENT_AMBULATORY_CARE_PROVIDER_SITE_OTHER): Payer: BC Managed Care – PPO | Admitting: Physician Assistant

## 2015-05-21 VITALS — BP 122/78 | HR 101 | Temp 98.3°F | Resp 18 | Ht 64.0 in | Wt 198.0 lb

## 2015-05-21 DIAGNOSIS — M79672 Pain in left foot: Secondary | ICD-10-CM | POA: Diagnosis not present

## 2015-05-21 DIAGNOSIS — H60392 Other infective otitis externa, left ear: Secondary | ICD-10-CM | POA: Diagnosis not present

## 2015-05-21 MED ORDER — NEOMYCIN-COLIST-HC-THONZONIUM 3.3-3-10-0.5 MG/ML OT SUSP
4.0000 [drp] | Freq: Once | OTIC | Status: AC
  Administered 2015-05-21: 4 [drp] via OTIC

## 2015-05-21 MED ORDER — CIPROFLOXACIN-HYDROCORTISONE 0.2-1 % OT SUSP: 3.0000 [drp] | Freq: Two times a day (BID) | OTIC | Status: DC

## 2015-05-21 NOTE — Patient Instructions (Signed)

## 2015-05-21 NOTE — Progress Notes (Signed)
Patient ID: [name], female    DOB: Jun 03, 1996, 19 y.o.   MRN: [id number]  PCP: Jeanne Bellows, MD  Subjective:   Chief Complaint  Patient presents with  . Ear Pain    monday left ear worse yesterday   . Adenopathy    noticed yesterday   . Depression    see screen    HPI Presents for evaluation of LEFT ear pain.  Accompanied by her mother.  Pain began on Monday 05/15/2015. Progressively worsening. Drainage from the ear, some blood last night. Has noticed a bump behind the ear as well. No fever. Possible chills yesterday. Stuffy nose. Cough is mild and non-productive ("I guess from smoking.").  In addition, she asks if she can have a new CAM walker.  She sustained a stress fracture of the LEFT foot after walking 10 miles about 3 weeks ago. She feels much better when she wears the CAM, but it was damaged when the sole of the boot caught on the edge of something, and now has a hole in the heel portion.    Review of Systems  Constitutional: Negative for fever and chills.  HENT: Positive for ear discharge and ear pain. Negative for congestion, sinus pressure, sore throat and voice change.   Eyes: Negative for visual disturbance.  Respiratory: Negative for cough, chest tightness and shortness of breath.   Gastrointestinal: Negative for nausea, vomiting and diarrhea.  Musculoskeletal: Positive for gait problem (LEFT foot pain).  Neurological: Positive for dizziness and headaches.       Patient Active Problem List   Diagnosis Date Noted  . Bipolar I disorder, most recent episode depressed   . Bipolar 2 disorder, major depressive episode 08/20/2014  . Attention deficit hyperactivity disorder (ADHD), combined type, severe 08/17/2014  . Salicylate overdose 25/85/2778  . Suicidal intent   . Obesity, unspecified 01/18/2013  . MDD (major depressive disorder), recurrent episode, moderate 07/06/2012  . ADHD (attention deficit hyperactivity disorder), predominantly hyperactive  impulsive type 07/06/2012  . Oppositional defiant disorder 07/06/2012  . Reactive attachment disorder of infancy/early childhood, disinhibited 07/06/2012  . PTSD (post-traumatic stress disorder) 07/06/2012  . Goiter   . Hashimoto's thyroiditis   . Other specified acquired hypothyroidism 03/18/2011  . Short stature 03/18/2011     Prior to Admission medications   Medication Sig Start Date End Date Taking? Authorizing Provider  cloNIDine HCl (KAPVAY) 0.1 MG TB12 ER tablet Take 0.1 mg by mouth.   Yes Historical Provider, MD  escitalopram (LEXAPRO) 10 MG tablet Take 10 mg by mouth daily.   Yes Historical Provider, MD  hydrOXYzine (VISTARIL) 25 MG capsule Take 25 mg by mouth 3 (three) times daily as needed.   Yes Historical Provider, MD  LamoTRIgine (LAMICTAL ODT) 50 MG TBDP Take 50 mg by mouth 2 (two) times daily.    Yes Historical Provider, MD  levonorgestrel-ethinyl estradiol (ORSYTHIA) 0.1-20 MG-MCG tablet Take 1 tablet by mouth daily. 08/22/14  Yes Kerrie Buffalo, NP  levothyroxine (SYNTHROID, LEVOTHROID) 125 MCG tablet Take 125 mcg by mouth daily before breakfast.   Yes Historical Provider, MD  lisdexamfetamine (VYVANSE) 40 MG capsule Take 40 mg by mouth every morning.   Yes Historical Provider, MD  lithium carbonate 300 MG capsule Take 1 capsule (300 mg total) by mouth every morning. 08/22/14  Yes Kerrie Buffalo, NP  lithium carbonate 600 MG capsule Take 1 capsule (600 mg total) by mouth at bedtime. 08/22/14  Yes Kerrie Buffalo, NP  lurasidone 20 MG TABS Take 1  tablet (20 mg total) by mouth at bedtime. 08/22/14  Yes Kerrie Buffalo, NP  Melatonin 5 MG TABS Take 1 tablet by mouth at bedtime.   Yes Historical Provider, MD  prazosin (MINIPRESS) 2 MG capsule Take 2 mg by mouth at bedtime.   Yes Historical Provider, MD  traMADol (ULTRAM) 50 MG tablet Take 1 tablet (50 mg total) by mouth every 8 (eight) hours as needed. 04/24/15  Yes Tishira R Brewington, PA-C  acetaminophen-codeine (TYLENOL #3)  300-30 MG per tablet Take 1-2 tablets by mouth every 4 (four) hours as needed. Patient not taking: Reported on 04/24/2015 08/22/14   Kerrie Buffalo, NP  lamoTRIgine (LAMICTAL) 100 MG tablet Take 1 tablet (100 mg total) by mouth daily. Patient not taking: Reported on 04/24/2015 08/22/14   Kerrie Buffalo, NP     Allergies  Allergen Reactions  . Alprazolam Other (See Comments)    Makes er very angry and hostile       Objective:  Physical Exam  Constitutional: She is oriented to person, place, and time. She appears well-developed and well-nourished. She is active and cooperative. No distress.  BP 122/78 mmHg  Pulse 101  Temp(Src) 98.3 F (36.8 C) (Oral)  Resp 18  Ht 5\' 4"  (1.626 m)  Wt 198 lb (89.812 kg)  BMI 33.97 kg/m2  SpO2 97%  LMP 05/14/2015   HENT:  Head: Normocephalic and atraumatic.  Right Ear: Hearing, tympanic membrane, external ear and ear canal normal.  Left Ear: There is swelling and tenderness.  Swelling of the LEFT pinna and tragus. Canal swollen closed. Soft yellow cerumen noted. Ear wick placed. Cortisporin-TC otic 4 gtts instilled.  Eyes: Conjunctivae are normal.  Pulmonary/Chest: Effort normal.  Musculoskeletal:       Left ankle: Normal. Achilles tendon normal.       Left foot: There is tenderness and bony tenderness. There is normal range of motion, no swelling and normal capillary refill.       Feet:  Lymphadenopathy:       Head (right side): No submental, no submandibular, no tonsillar, no preauricular, no posterior auricular and no occipital adenopathy present.       Head (left side): Posterior auricular adenopathy present. No submental, no submandibular, no tonsillar, no preauricular and no occipital adenopathy present.    She has no cervical adenopathy.       Right: No supraclavicular adenopathy present.       Left: No supraclavicular adenopathy present.  Neurological: She is alert and oriented to person, place, and time.  Psychiatric: She has a  normal mood and affect. Her speech is normal and behavior is normal.  Known depression is stable. Managed by psychiatry.           Assessment & Plan:   1. Otitis, externa, infective, left Anticipatory guidance. NSAIDS/acetamoniphen as needed. RTC in 2 days for re-evaluation, sooner if symptoms worsen. - ciprofloxacin-hydrocortisone (CIPRO HC) otic suspension; Place 3 drops into the left ear 2 (two) times daily.  Dispense: 10 mL; Refill: 0 - neomycin-colistin-hydrocortisone-thonzonium (CORTISPORIN TC) otic suspension 4 drop; Place 4 drops into the left ear once.  2. Left foot pain Replaced broken CAM walker.   Fara Chute, PA-C Physician Assistant-Certified [facility]

## 2015-06-06 ENCOUNTER — Ambulatory Visit (INDEPENDENT_AMBULATORY_CARE_PROVIDER_SITE_OTHER): Payer: BC Managed Care – PPO | Admitting: Family Medicine

## 2015-06-06 ENCOUNTER — Encounter (HOSPITAL_COMMUNITY): Payer: Self-pay

## 2015-06-06 ENCOUNTER — Ambulatory Visit (HOSPITAL_COMMUNITY)
Admission: RE | Admit: 2015-06-06 | Discharge: 2015-06-06 | Disposition: A | Discharge status: Home / Self Care | Payer: BC Managed Care – PPO | Source: Ambulatory Visit | Attending: Family Medicine | Admitting: Family Medicine

## 2015-06-06 VITALS — BP 118/78 | HR 103 | Temp 98.3°F | Resp 18 | Ht 65.0 in | Wt 193.2 lb

## 2015-06-06 DIAGNOSIS — R55 Syncope and collapse: Secondary | ICD-10-CM

## 2015-06-06 DIAGNOSIS — R109 Unspecified abdominal pain: Secondary | ICD-10-CM | POA: Insufficient documentation

## 2015-06-06 DIAGNOSIS — R300 Dysuria: Secondary | ICD-10-CM

## 2015-06-06 DIAGNOSIS — R1011 Right upper quadrant pain: Secondary | ICD-10-CM

## 2015-06-06 DIAGNOSIS — N926 Irregular menstruation, unspecified: Secondary | ICD-10-CM | POA: Diagnosis not present

## 2015-06-06 DIAGNOSIS — R1031 Right lower quadrant pain: Secondary | ICD-10-CM

## 2015-06-06 DIAGNOSIS — N10 Acute tubulo-interstitial nephritis: Secondary | ICD-10-CM | POA: Diagnosis not present

## 2015-06-06 DIAGNOSIS — R110 Nausea: Secondary | ICD-10-CM

## 2015-06-06 DIAGNOSIS — K920 Hematemesis: Secondary | ICD-10-CM | POA: Diagnosis not present

## 2015-06-06 DIAGNOSIS — R1010 Upper abdominal pain, unspecified: Secondary | ICD-10-CM

## 2015-06-06 LAB — POCT CBC
Granulocyte percent: 72.6 %G (ref 37–80)
HEMATOCRIT: 39.5 % (ref 37.7–47.9)
HEMOGLOBIN: 12.4 g/dL (ref 12.2–16.2)
LYMPH, POC: 2.9 (ref 0.6–3.4)
MCH, POC: 24 pg — AB (ref 27–31.2)
MCHC: 31.3 g/dL — AB (ref 31.8–35.4)
MCV: 76.8 fL — AB (ref 80–97)
MID (cbc): 1.2 — AB (ref 0–0.9)
MPV: 6.8 fL (ref 0–99.8)
POC Granulocyte: 11 — AB (ref 2–6.9)
POC LYMPH PERCENT: 19.4 %L (ref 10–50)
POC MID %: 8 %M (ref 0–12)
Platelet Count, POC: 396 10*3/uL (ref 142–424)
RBC: 5.15 M/uL (ref 4.04–5.48)
RDW, POC: 15.3 %
WBC: 15.1 10*3/uL — AB (ref 4.6–10.2)

## 2015-06-06 LAB — POCT URINALYSIS DIPSTICK
Bilirubin, UA: NEGATIVE
Glucose, UA: NEGATIVE
KETONES UA: NEGATIVE
NITRITE UA: NEGATIVE
PH UA: 6
Protein, UA: 30
Spec Grav, UA: 1.02
Urobilinogen, UA: 1

## 2015-06-06 LAB — COMPREHENSIVE METABOLIC PANEL
ALK PHOS: 105 U/L (ref 47–176)
ALT: 21 U/L (ref 5–32)
AST: 17 U/L (ref 12–32)
Albumin: 3.9 g/dL (ref 3.6–5.1)
BILIRUBIN TOTAL: 0.3 mg/dL (ref 0.2–1.1)
BUN: 9 mg/dL (ref 7–20)
CO2: 23 mmol/L (ref 20–31)
CREATININE: 0.81 mg/dL (ref 0.50–1.00)
Calcium: 9.2 mg/dL (ref 8.9–10.4)
Chloride: 104 mmol/L (ref 98–110)
Glucose, Bld: 95 mg/dL (ref 65–99)
Potassium: 4.1 mmol/L (ref 3.8–5.1)
SODIUM: 137 mmol/L (ref 135–146)
Total Protein: 6.5 g/dL (ref 6.3–8.2)

## 2015-06-06 LAB — POCT UA - MICROSCOPIC ONLY
Casts, Ur, LPF, POC: NEGATIVE
Crystals, Ur, HPF, POC: NEGATIVE
Mucus, UA: NEGATIVE
Yeast, UA: NEGATIVE

## 2015-06-06 MED ORDER — CEFTRIAXONE SODIUM 1 G IJ SOLR: 1.0000 g | Freq: Once | INTRAMUSCULAR | Status: DC

## 2015-06-06 MED ORDER — ONDANSETRON HCL 4 MG PO TABS: 4.0000 mg | ORAL_TABLET | Freq: Three times a day (TID) | ORAL | Status: DC | PRN

## 2015-06-06 MED ORDER — CIPROFLOXACIN HCL 500 MG PO TABS: 500.0000 mg | ORAL_TABLET | Freq: Two times a day (BID) | ORAL | Status: DC

## 2015-06-06 MED ORDER — IOHEXOL 300 MG/ML  SOLN
100.0000 mL | Freq: Once | INTRAMUSCULAR | Status: AC | PRN
  Administered 2015-06-06: 100 mL via INTRAVENOUS

## 2015-06-06 MED ORDER — IOHEXOL 300 MG/ML  SOLN
25.0000 mL | INTRAMUSCULAR | Status: AC
  Administered 2015-06-06: 25 mL via INTRAVENOUS

## 2015-06-06 MED ORDER — OMEPRAZOLE 40 MG PO CPDR
40.0000 mg | DELAYED_RELEASE_CAPSULE | Freq: Every day | ORAL | Status: DC
Start: 2015-06-06 — End: 2016-03-28

## 2015-06-06 MED ORDER — CEFTRIAXONE SODIUM 500 MG IJ SOLR: 500.0000 mg | Freq: Once | INTRAMUSCULAR | Status: DC

## 2015-06-06 MED ORDER — CEFTRIAXONE SODIUM 1 G IJ SOLR: 500.0000 mg | Freq: Once | INTRAMUSCULAR | Status: DC

## 2015-06-06 MED ORDER — CEFTRIAXONE SODIUM 1 G IJ SOLR
500.0000 mg | Freq: Once | INTRAMUSCULAR | Status: AC
  Administered 2015-06-06: 500 mg via INTRAMUSCULAR
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
## 2015-06-06 NOTE — Patient Instructions (Addendum)
Go to [facility] register at Radiology for outpatient CT. If you go in main entrance and stop at the desk they will let you know where to go.   Pyelonephritis, Adult Pyelonephritis is a kidney infection. In general, there are 2 main types of pyelonephritis:  Infections that come on quickly without any warning (acute pyelonephritis).  Infections that persist for a long period of time (chronic pyelonephritis). CAUSES  Two main causes of pyelonephritis are:  Bacteria traveling from the bladder to the kidney. This is a problem especially in pregnant women. The urine in the bladder can become filled with bacteria from multiple causes, including:  Inflammation of the prostate gland (prostatitis).  Sexual intercourse in females.  Bladder infection (cystitis).  Bacteria traveling from the bloodstream to the tissue part of the kidney. Problems that may increase your risk of getting a kidney infection include:  Diabetes.  Kidney stones or bladder stones.  Cancer.  Catheters placed in the bladder.  Other abnormalities of the kidney or ureter. SYMPTOMS   Abdominal pain.  Pain in the side or flank area.  Fever.  Chills.  Upset stomach.  Blood in the urine (dark urine).  Frequent urination.  Strong or persistent urge to urinate.  Burning or stinging when urinating. DIAGNOSIS  Your caregiver may diagnose your kidney infection based on your symptoms. A urine sample may also be taken. TREATMENT  In general, treatment depends on how severe the infection is.   If the infection is mild and caught early, your caregiver may treat you with oral antibiotics and send you home.  If the infection is more severe, the bacteria may have gotten into the bloodstream. This will require intravenous (IV) antibiotics and a hospital stay. Symptoms may include:  High fever.  Severe flank pain.  Shaking chills.  Even after a hospital stay, your caregiver may require you to be on  oral antibiotics for a period of time.  Other treatments may be required depending upon the cause of the infection. HOME CARE INSTRUCTIONS   Take your antibiotics as directed. Finish them even if you start to feel better.  Make an appointment to have your urine checked to make sure the infection is gone.  Drink enough fluids to keep your urine clear or pale yellow.  Take medicines for the bladder if you have urgency and frequency of urination as directed by your caregiver. SEEK IMMEDIATE MEDICAL CARE IF:   You have a fever or persistent symptoms for more than 2-3 days.  You have a fever and your symptoms suddenly get worse.  You are unable to take your antibiotics or fluids.  You develop shaking chills.  You experience extreme weakness or fainting.  There is no improvement after 2 days of treatment. MAKE SURE YOU:  Understand these instructions.  Will watch your condition.  Will get help right away if you are not doing well or get worse. Document Released: 09/16/2005 Document Revised: 03/17/2012 Document Reviewed: 02/20/2011 [facility] Patient Information 2015 [place], [place]. This information is not intended to replace advice given to you by your health care provider. Make sure you discuss any questions you have with your health care provider.

## 2015-06-07 ENCOUNTER — Ambulatory Visit (INDEPENDENT_AMBULATORY_CARE_PROVIDER_SITE_OTHER): Payer: BC Managed Care – PPO | Admitting: Family Medicine

## 2015-06-07 VITALS — BP 122/84 | HR 114 | Temp 98.2°F | Resp 16 | Wt 194.8 lb

## 2015-06-07 DIAGNOSIS — R300 Dysuria: Secondary | ICD-10-CM

## 2015-06-07 DIAGNOSIS — N10 Acute tubulo-interstitial nephritis: Secondary | ICD-10-CM

## 2015-06-07 LAB — GC/CHLAMYDIA PROBE AMP
CT Probe RNA: NEGATIVE
GC Probe RNA: NEGATIVE

## 2015-06-07 MED ORDER — FLUCONAZOLE 150 MG PO TABS: 150.0000 mg | ORAL_TABLET | Freq: Once | ORAL | Status: DC

## 2015-06-07 MED ORDER — PHENAZOPYRIDINE HCL 200 MG PO TABS: 200.0000 mg | ORAL_TABLET | Freq: Three times a day (TID) | ORAL | Status: DC

## 2015-06-07 NOTE — Patient Instructions (Signed)
Pyelonephritis, Adult °Pyelonephritis is a kidney infection. In general, there are 2 main types of pyelonephritis: °· Infections that come on quickly without any warning (acute pyelonephritis). °· Infections that persist for a long period of time (chronic pyelonephritis). °CAUSES  °Two main causes of pyelonephritis are: °· Bacteria traveling from the bladder to the kidney. This is a problem especially in pregnant women. The urine in the bladder can become filled with bacteria from multiple causes, including: °¨ Inflammation of the prostate gland (prostatitis). °¨ Sexual intercourse in females. °¨ Bladder infection (cystitis). °· Bacteria traveling from the bloodstream to the tissue part of the kidney. °Problems that may increase your risk of getting a kidney infection include: °· Diabetes. °· Kidney stones or bladder stones. °· Cancer. °· Catheters placed in the bladder. °· Other abnormalities of the kidney or ureter. °SYMPTOMS  °· Abdominal pain. °· Pain in the side or flank area. °· Fever. °· Chills. °· Upset stomach. °· Blood in the urine (dark urine). °· Frequent urination. °· Strong or persistent urge to urinate. °· Burning or stinging when urinating. °DIAGNOSIS  °Your caregiver may diagnose your kidney infection based on your symptoms. A urine sample may also be taken. °TREATMENT  °In general, treatment depends on how severe the infection is.  °· If the infection is mild and caught early, your caregiver may treat you with oral antibiotics and send you home. °· If the infection is more severe, the bacteria may have gotten into the bloodstream. This will require intravenous (IV) antibiotics and a hospital stay. Symptoms may include: °¨ High fever. °¨ Severe flank pain. °¨ Shaking chills. °· Even after a hospital stay, your caregiver may require you to be on oral antibiotics for a period of time. °· Other treatments may be required depending upon the cause of the infection. °HOME CARE INSTRUCTIONS  °· Take your  antibiotics as directed. Finish them even if you start to feel better. °· Make an appointment to have your urine checked to make sure the infection is gone. °· Drink enough fluids to keep your urine clear or pale yellow. °· Take medicines for the bladder if you have urgency and frequency of urination as directed by your caregiver. °SEEK IMMEDIATE MEDICAL CARE IF:  °· You have a fever or persistent symptoms for more than 2-3 days. °· You have a fever and your symptoms suddenly get worse. °· You are unable to take your antibiotics or fluids. °· You develop shaking chills. °· You experience extreme weakness or fainting. °· There is no improvement after 2 days of treatment. °MAKE SURE YOU: °· Understand these instructions. °· Will watch your condition. °· Will get help right away if you are not doing well or get worse. °Document Released: 09/16/2005 Document Revised: 03/17/2012 Document Reviewed: 02/20/2011 °ExitCare® Patient Information ©2015 [employer]. This information is not intended to replace advice given to you by your health care provider. Make sure you discuss any questions you have with your health care provider. ° °

## 2015-06-08 LAB — URINE CULTURE
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
## 2016-01-30 ENCOUNTER — Other Ambulatory Visit: Payer: Self-pay | Admitting: Family Medicine

## 2016-01-30 DIAGNOSIS — R109 Unspecified abdominal pain: Secondary | ICD-10-CM

## 2016-02-02 ENCOUNTER — Other Ambulatory Visit: Payer: Self-pay

## 2016-03-12 ENCOUNTER — Ambulatory Visit (INDEPENDENT_AMBULATORY_CARE_PROVIDER_SITE_OTHER): Payer: BC Managed Care – PPO | Admitting: Physician Assistant

## 2016-03-12 VITALS — BP 122/82 | HR 73 | Temp 98.8°F | Resp 16 | Ht 64.0 in | Wt 176.0 lb

## 2016-03-12 DIAGNOSIS — R109 Unspecified abdominal pain: Secondary | ICD-10-CM | POA: Diagnosis not present

## 2016-03-12 DIAGNOSIS — Z23 Encounter for immunization: Secondary | ICD-10-CM | POA: Diagnosis not present

## 2016-03-12 DIAGNOSIS — S61211A Laceration without foreign body of left index finger without damage to nail, initial encounter: Secondary | ICD-10-CM

## 2016-03-12 DIAGNOSIS — G8929 Other chronic pain: Secondary | ICD-10-CM

## 2016-03-12 DIAGNOSIS — S61219A Laceration without foreign body of unspecified finger without damage to nail, initial encounter: Secondary | ICD-10-CM

## 2016-03-12 LAB — POCT URINALYSIS DIP (MANUAL ENTRY)
BILIRUBIN UA: NEGATIVE
GLUCOSE UA: NEGATIVE
Ketones, POC UA: NEGATIVE
Leukocytes, UA: NEGATIVE
NITRITE UA: NEGATIVE
PH UA: 6
Protein Ur, POC: NEGATIVE
Spec Grav, UA: 1.01
UROBILINOGEN UA: 0.2

## 2016-03-12 LAB — POC MICROSCOPIC URINALYSIS (UMFC): MUCUS RE: ABSENT

## 2016-03-12 NOTE — Patient Instructions (Addendum)
WOUND CARE Please return in 10 days to have your stitches/staples removed or sooner if you have concerns. . Keep area clean and dry for 24 hours. Do not remove bandage, if applied. . After 24 hours, remove bandage and wash wound gently with mild soap and warm water. Reapply a new bandage after cleaning wound, if directed. . Continue daily cleansing with soap and water until stitches/staples are removed. . Do not apply any ointments or creams to the wound while stitches/staples are in place, as this may cause delayed healing. . Notify the office if you experience any of the following signs of infection: Swelling, redness, pus drainage, streaking, fever >101.0 F . Notify the office if you experience excessive bleeding that does not stop after 15-20 minutes of constant, firm pressure.      IF you received an x-ray today, you will receive an invoice from [facility]. Please contact [facility] at [phone] with questions or concerns regarding your invoice.   IF you received labwork today, you will receive an invoice from [facility]. Please contact [facility] at [phone] with questions or concerns regarding your invoice.   Our billing staff will not be able to assist you with questions regarding bills from these companies.  You will be contacted with the lab results as soon as they are available. The fastest way to get your results is to activate your My Chart account. Instructions are located on the last page of this paperwork. If you have not heard from us regarding the results in 2 weeks, please contact this office.      

## 2016-03-12 NOTE — Progress Notes (Signed)
03/12/2016 2:54 PM   DOB: [birth year] / MRN: [id number]  SUBJECTIVE:  [name] is a 20 y.o. female presenting for a laceration sustained to the left index finger while cutting some vegetables.  She denies paresthesia, weakness, and change in function of the digit.  She has not taken any medication.   She complains of abdominal pain today and she has seen her PCP regarding this roughly 3 weeks ago.  A CT scan was ordered and then cancelled at the request of either the patient or the mother.  She denies fever, chills, nausea, dysuria, frequency, urgency, hematuria, dizziness, constipation and diarrhea. She is not getting worse or better.     She is allergic to alprazolam.   She  has a past medical history of ADD (attention deficit disorder); Goiter; Hashimoto disease; Constitutional growth delay; Headache(784.0); Anxiety; Obesity, unspecified (01/18/2013); Depression; Allergy; ODD (oppositional defiant disorder); PTSD (post-traumatic stress disorder); and Reactive attachment disorder.    She  reports that she has been smoking Cigarettes.  She has a 1 pack-year smoking history. She has never used smokeless tobacco. She reports that she does not drink alcohol or use illicit drugs. She  reports that she does not engage in sexual activity. The patient  has past surgical history that includes Tonsillectomy and Tympanostomy tube placement.  Her family history is not on file. She was adopted.  Review of Systems  Constitutional: Negative for fever and chills.  Respiratory: Negative for cough.   Cardiovascular: Negative for chest pain.  Gastrointestinal: Positive for abdominal pain. Negative for nausea and vomiting.  Genitourinary: Positive for flank pain. Negative for dysuria, urgency, frequency and hematuria.  Skin: Negative for rash.  Neurological: Negative for dizziness and headaches.    Problem list and medications reviewed and updated by myself where necessary, and exist elsewhere in the  encounter.   OBJECTIVE:  BP 122/82 mmHg  Pulse 73  Temp(Src) 98.8 F (37.1 C)  Resp 16  Ht 5\' 4"  (1.626 m)  Wt 176 lb (79.833 kg)  BMI 30.20 kg/m2  SpO2 99%  LMP 03/11/2016 (Approximate)  Physical Exam  Constitutional: She is oriented to person, place, and time.  Cardiovascular: Normal rate and regular rhythm.   Pulmonary/Chest: Effort normal and breath sounds normal.  Abdominal: Soft. Bowel sounds are normal. She exhibits no distension and no mass. There is no tenderness. There is no rebound and no guarding.  Musculoskeletal: Normal range of motion.       Hands: Neurological: She is alert and oriented to person, place, and time.  Skin: Skin is warm and dry.  Psychiatric: She has a normal mood and affect.  Nursing note and vitals reviewed.  Risk and benefits discussed and verbal consent obtained. Anesthetic allergies reviewed. Patient anesthetized using 1:1 mix of 2% lidocaine without epi and Marcaine. The wound was cleansed thoroughly with soap and water. Sterile prep and drape. Wound closed with 4 throws using 4-0 Ethilon suture material. Hemostasis achieved. Mupirocin applied to the wound and bandage placed. The patient tolerated well. Wound instructions were provided and the patient is to return in 10 days for suture removal.    Results for orders placed or performed in visit on 03/12/16 (from the past 72 hour(s))  POCT urinalysis dipstick     Status: Abnormal   Collection Time: 03/12/16  2:28 PM  Result Value Ref Range   Color, UA yellow yellow   Clarity, UA clear clear   Glucose, UA negative negative   Bilirubin, UA negative negative  Ketones, POC UA negative negative   Spec Grav, UA 1.010    Blood, UA moderate (A) negative   pH, UA 6.0    Protein Ur, POC negative negative   Urobilinogen, UA 0.2    Nitrite, UA Negative Negative   Leukocytes, UA Negative Negative  POCT Microscopic Urinalysis ([facility])     Status: None   Collection Time: 03/12/16  2:33 PM  Result  Value Ref Range   WBC,UR,HPF,POC None None WBC/hpf   RBC,UR,HPF,POC None None RBC/hpf   Bacteria None None, Too numerous to count   Mucus Absent Absent   Epithelial Cells, UR Per Microscopy None None, Too numerous to count cells/hpf    No results found.  ASSESSMENT AND PLAN  [name] was seen today for laceration, flank pain and abdominal pain.  Diagnoses and all orders for this visit:  Chronic abdominal pain: Her urine dip is normal.  She is just coming off of her menses as of yesterday.  Abdominal exam reassuring.  Given that Dr. Justin Mend did order a CT and I can not see what labs have been done I have advised that she return to Dr. Justin Mend for further evaluation and management of this problem.  -     POCT Microscopic Urinalysis ([facility]) -     POCT urinalysis dipstick  Finger laceration, initial encounter -     Td vaccine greater than or equal to 7yo preservative free IM    The patient was advised to call or return to clinic if she does not see an improvement in symptoms or to seek the care of the closest emergency department if she worsens with the above plan.   Philis Fendt, MHS, PA-C [facility] 03/12/2016 2:54 PM

## 2016-03-28 ENCOUNTER — Ambulatory Visit (INDEPENDENT_AMBULATORY_CARE_PROVIDER_SITE_OTHER): Payer: BC Managed Care – PPO | Admitting: Physician Assistant

## 2016-03-28 VITALS — BP 116/72 | HR 82 | Temp 98.8°F | Resp 18 | Ht 64.0 in

## 2016-03-28 DIAGNOSIS — S61512A Laceration without foreign body of left wrist, initial encounter: Secondary | ICD-10-CM

## 2016-03-28 DIAGNOSIS — Z7289 Other problems related to lifestyle: Secondary | ICD-10-CM

## 2016-03-28 DIAGNOSIS — S51802A Unspecified open wound of left forearm, initial encounter: Secondary | ICD-10-CM

## 2016-03-28 DIAGNOSIS — F489 Nonpsychotic mental disorder, unspecified: Secondary | ICD-10-CM

## 2016-03-28 NOTE — Patient Instructions (Addendum)
WOUND CARE Please return in 7-10 days to have your stitches/staples removed or sooner if you have concerns. Marland Kitchen Keep area clean and dry for 24 hours. Do not remove bandage, if applied. . After 24 hours, remove bandage and wash wound gently with mild soap and warm water. Reapply a new bandage after cleaning wound, if directed. . Continue daily cleansing with soap and water until stitches/staples are removed. . Do not apply any ointments or creams to the wound while stitches/staples are in place, as this may cause delayed healing. . Notify the office if you experience any of the following signs of infection: Swelling, redness, pus drainage, streaking, fever >101.0 F . Notify the office if you experience excessive bleeding that does not stop after 15-20 minutes of constant, firm pressure.      IF you received an x-ray today, you will receive an invoice from [facility]. Please contact [facility] at [phone] with questions or concerns regarding your invoice.   IF you received labwork today, you will receive an invoice from [facility]. Please contact [facility] at [phone] with questions or concerns regarding your invoice.   Our billing staff will not be able to assist you with questions regarding bills from these companies.  You will be contacted with the lab results as soon as they are available. The fastest way to get your results is to activate your My Chart account. Instructions are located on the last page of this paperwork. If you have not heard from [place] regarding the results in 2 weeks, please contact this office.

## 2016-03-28 NOTE — Progress Notes (Signed)
Patient ID: [name], female    DOB: March 18, 1996, 20 y.o.   MRN: [id number]  PCP: Jonathon Bellows, MD  Subjective:   Chief Complaint  Patient presents with  . Laceration    left arm mutliple cuts, actively bleeding    HPI Presents for evaluation of wounds on the LEFT forearm due to self-mutilation. She is accompanied by her mother.  "I cut myself. My bipolar switched from happy to sad just like that (she snapped her fingers). It was not a suicide attempt."  Tetanus is current.  She cut herself with a folding knife, then contacted a friend who took the knife. She had done some cutting last night as well, which her mother did not know until today.  Has some support people, friends, a Licensed conveyancer at the [employer], a therapist.  Go for a walk. Parents have previously checked her pockets regularly, but not recently. She states that they do not need to resume that practice.  She has not been successful in finding work, so spends most days "doing nothing," hanging out with friends downtown, playing at [employer]. Is meeting with a [employer] tomorrow about possibly applying for service. She reports that she provided a list of her medications to him already and that he said the only concern was her thyroid medication and wondered if that could be managed without levothyroxine.   Review of Systems As above. Denies suicidal thoughts.    Patient Active Problem List   Diagnosis Date Noted  . Bipolar I disorder, most recent episode depressed ([place])   . Bipolar 2 disorder, major depressive episode ([place]) 08/20/2014  . Attention deficit hyperactivity disorder (ADHD), combined type, severe 08/17/2014  . Salicylate overdose [id number]  . Suicidal intent   . Obesity, unspecified 01/18/2013  . MDD (major depressive disorder), recurrent episode, moderate ([place]) 07/06/2012  . ADHD (attention deficit hyperactivity disorder), predominantly hyperactive impulsive type  07/06/2012  . Oppositional defiant disorder 07/06/2012  . Reactive attachment disorder of infancy/early childhood, disinhibited 07/06/2012  . PTSD (post-traumatic stress disorder) 07/06/2012  . Goiter   . Hashimoto's thyroiditis   . Other specified acquired hypothyroidism 03/18/2011  . Short stature 03/18/2011     Prior to Admission medications   Medication Sig Start Date End Date Taking? Authorizing Provider  cloNIDine HCl (KAPVAY) 0.1 MG TB12 ER tablet Take 0.1 mg by mouth.   Yes Historical Provider, MD  hydrOXYzine (VISTARIL) 25 MG capsule Take 25 mg by mouth 3 (three) times daily as needed.   Yes Historical Provider, MD  levonorgestrel-ethinyl estradiol (ORSYTHIA) 0.1-20 MG-MCG tablet Take 1 tablet by mouth daily. 08/22/14  Yes Kerrie Buffalo, NP  levothyroxine (SYNTHROID, LEVOTHROID) 125 MCG tablet Take 125 mcg by mouth daily before breakfast.   Yes Historical Provider, MD  lisdexamfetamine (VYVANSE) 40 MG capsule Take 40 mg by mouth every morning.   Yes Historical Provider, MD  lithium carbonate 600 MG capsule Take 1 capsule (600 mg total) by mouth at bedtime. 08/22/14  Yes Kerrie Buffalo, NP  lurasidone 20 MG TABS Take 1 tablet (20 mg total) by mouth at bedtime. 08/22/14  Yes Kerrie Buffalo, NP  Melatonin 5 MG TABS Take 1 tablet by mouth at bedtime.   Yes Historical Provider, MD  prazosin (MINIPRESS) 2 MG capsule Take 2 mg by mouth at bedtime. Reported on 03/12/2016   Yes Historical Provider, MD  sertraline (ZOLOFT) 100 MG tablet Take 100 mg by mouth daily. Reported on 03/12/2016   Yes Historical Provider, MD  escitalopram (LEXAPRO) 10 MG tablet Take 10 mg by mouth daily. Reported on 03/28/2016    Historical Provider, MD  LamoTRIgine (LAMICTAL ODT) 50 MG TBDP Take 50 mg by mouth 2 (two) times daily. Reported on 03/28/2016    Historical Provider, MD  phenazopyridine (PYRIDIUM) 200 MG tablet Take 1 tablet (200 mg total) by mouth 3 (three) times daily with meals. Patient not taking:  Reported on 03/12/2016 06/07/15   Shawnee Knapp, MD     Allergies  Allergen Reactions  . Alprazolam Other (See Comments)    Makes er very angry and hostile       Objective:  Physical Exam  Constitutional: She is oriented to person, place, and time. She appears well-developed and well-nourished. She is active and cooperative. No distress.  BP 116/72 mmHg  Pulse 82  Temp(Src) 98.8 F (37.1 C) (Oral)  Resp 18  Ht 5\' 4"  (1.626 m)  SpO2 98%  LMP 03/28/2016   Eyes: Conjunctivae are normal.  Pulmonary/Chest: Effort normal.  Neurological: She is alert and oriented to person, place, and time.  Skin: Skin is warm and dry. Laceration (horizontal lacerations on the volar and dorsal surfaces on the LEFT forearm. 4 on the ventral surface are actively bleeding and one on the dorsal wrist is from yesterday. Others are shallow.) noted.  Psychiatric: She has a normal mood and affect. Her speech is normal and behavior is normal.    PROCEDURE: Verbal consent obtained from patient.  Local anesthesia with 4cc Lidocaine 2% without epinephrine.  Wound scrubbed with soap and water and rinsed.  Wound closed with #13 5-0 Prolene simple interrupted and horizontal mattress sutures.  Wound cleansed and dressed.        Assessment & Plan:   1. Wound, open, arm, forearm, left, initial encounter 2. Laceration of left wrist, initial encounter 3. Deliberate self-cutting Local wound care. Reviewed plan for future compulsions to cut or otherwise harm herself. Not presently suicidal.   Fara Chute, PA-C Physician Assistant-Certified [facility]

## 2016-04-13 ENCOUNTER — Ambulatory Visit (INDEPENDENT_AMBULATORY_CARE_PROVIDER_SITE_OTHER): Payer: BC Managed Care – PPO

## 2016-04-13 ENCOUNTER — Ambulatory Visit (INDEPENDENT_AMBULATORY_CARE_PROVIDER_SITE_OTHER): Payer: BC Managed Care – PPO | Admitting: Physician Assistant

## 2016-04-13 VITALS — BP 120/74 | HR 66 | Temp 97.8°F | Resp 12 | Ht 64.0 in | Wt 170.0 lb

## 2016-04-13 DIAGNOSIS — M25421 Effusion, right elbow: Secondary | ICD-10-CM | POA: Diagnosis not present

## 2016-04-13 DIAGNOSIS — M25521 Pain in right elbow: Secondary | ICD-10-CM
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
## 2016-04-13 NOTE — Patient Instructions (Addendum)
Come back on Wednesday the 19th.  Take [phone] mg of Tylenol every eight hours for pain. Keep the arm immobilized for five days however please do daily range of motion exercises.  Ice your arm 20-30 minutes 2 to 3 times daily.     IF you received an x-ray today, you will receive an invoice from [facility]. Please contact [facility] at [phone] with questions or concerns regarding your invoice.   IF you received labwork today, you will receive an invoice from [employer]. Please contact [facility] at [phone] with questions or concerns regarding your invoice.   Our billing staff will not be able to assist you with questions regarding bills from these companies.  You will be contacted with the lab results as soon as they are available. The fastest way to get your results is to activate your My Chart account. Instructions are located on the last page of this paperwork. If you have not heard from [place] regarding the results in 2 weeks, please contact this office.

## 2016-06-15 ENCOUNTER — Encounter: Payer: Self-pay | Admitting: Physician Assistant

## 2016-06-15 ENCOUNTER — Ambulatory Visit (INDEPENDENT_AMBULATORY_CARE_PROVIDER_SITE_OTHER): Payer: BC Managed Care – PPO | Admitting: Physician Assistant

## 2016-06-15 VITALS — BP 123/80 | HR 66 | Temp 97.8°F | Resp 16 | Ht 64.5 in | Wt 169.0 lb

## 2016-06-15 DIAGNOSIS — N390 Urinary tract infection, site not specified: Secondary | ICD-10-CM | POA: Diagnosis not present

## 2016-06-15 DIAGNOSIS — R300 Dysuria: Secondary | ICD-10-CM

## 2016-06-15 LAB — POCT URINALYSIS DIP (MANUAL ENTRY)
BILIRUBIN UA: NEGATIVE
Glucose, UA: NEGATIVE
Ketones, POC UA: NEGATIVE
NITRITE UA: NEGATIVE
PH UA: 7
PROTEIN UA: NEGATIVE
RBC UA: NEGATIVE
Spec Grav, UA: 1.01
Urobilinogen, UA: 0.2

## 2016-06-15 LAB — POC MICROSCOPIC URINALYSIS (UMFC)

## 2016-06-15 MED ORDER — NITROFURANTOIN MONOHYD MACRO 100 MG PO CAPS: 100.0000 mg | ORAL_CAPSULE | Freq: Two times a day (BID) | ORAL | 0 refills | Status: DC
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
## 2016-06-15 NOTE — Patient Instructions (Addendum)
Take antibiotic as prescribed. We will contact you with your urine culture results. -Return to clinic if symptoms worsen, do not improve, or as needed Thank you for letting me participate in your health and well being.     IF you received an x-ray today, you will receive an invoice from [facility]. Please contact [facility] at [phone] with questions or concerns regarding your invoice.   IF you received labwork today, you will receive an invoice from [employer]. Please contact [facility] at [phone] with questions or concerns regarding your invoice.   Our billing staff will not be able to assist you with questions regarding bills from these companies.  You will be contacted with the lab results as soon as they are available. The fastest way to get your results is to activate your My Chart account. Instructions are located on the last page of this paperwork. If you have not heard from [place] regarding the results in 2 weeks, please contact this office.     Urinary Tract Infection Urinary tract infections (UTIs) can develop anywhere along your urinary tract. Your urinary tract is your body's drainage system for removing wastes and extra water. Your urinary tract includes two kidneys, two ureters, a bladder, and a urethra. Your kidneys are a pair of bean-shaped organs. Each kidney is about the size of your fist. They are located below your ribs, one on each side of your spine. CAUSES Infections are caused by microbes, which are microscopic organisms, including fungi, viruses, and bacteria. These organisms are so small that they can only be seen through a microscope. Bacteria are the microbes that most commonly cause UTIs. SYMPTOMS  Symptoms of UTIs may vary by age and gender of the patient and by the location of the infection. Symptoms in young women typically include a frequent and intense urge to urinate and a painful, burning feeling in the  bladder or urethra during urination. Older women and men are more likely to be tired, shaky, and weak and have muscle aches and abdominal pain. A fever may mean the infection is in your kidneys. Other symptoms of a kidney infection include pain in your back or sides below the ribs, nausea, and vomiting. DIAGNOSIS To diagnose a UTI, your caregiver will ask you about your symptoms. Your caregiver will also ask you to provide a urine sample. The urine sample will be tested for bacteria and white blood cells. White blood cells are made by your body to help fight infection. TREATMENT  Typically, UTIs can be treated with medication. Because most UTIs are caused by a bacterial infection, they usually can be treated with the use of antibiotics. The choice of antibiotic and length of treatment depend on your symptoms and the type of bacteria causing your infection. HOME CARE INSTRUCTIONS  If you were prescribed antibiotics, take them exactly as your caregiver instructs you. Finish the medication even if you feel better after you have only taken some of the medication.  Drink enough water and fluids to keep your urine clear or pale yellow.  Avoid caffeine, tea, and carbonated beverages. They tend to irritate your bladder.  Empty your bladder often. Avoid holding urine for long periods of time.  Empty your bladder before and after sexual intercourse.  After a bowel movement, women should cleanse from front to back. Use each tissue only once. SEEK MEDICAL CARE IF:   You have back pain.  You develop a fever.  Your symptoms do not begin to resolve  within 3 days. SEEK IMMEDIATE MEDICAL CARE IF:   You have severe back pain or lower abdominal pain.  You develop chills.  You have nausea or vomiting.  You have continued burning or discomfort with urination. MAKE SURE YOU:   Understand these instructions.  Will watch your condition.  Will get help right away if you are not doing well or get  worse.   This information is not intended to replace advice given to you by your health care provider. Make sure you discuss any questions you have with your health care provider.   Document Released: 06/26/2005 Document Revised: 06/07/2015 Document Reviewed: 10/25/2011 Elsevier Interactive Patient Education [employer].

## 2016-06-16 LAB — URINE CULTURE

## 2016-06-25 ENCOUNTER — Emergency Department (HOSPITAL_COMMUNITY)
Admission: EM | Admit: 2016-06-25 | Discharge: 2016-06-25 | Disposition: A | Discharge status: Home / Self Care | Payer: BC Managed Care – PPO | Attending: Emergency Medicine | Admitting: Emergency Medicine

## 2016-06-25 ENCOUNTER — Encounter (HOSPITAL_COMMUNITY): Payer: Self-pay

## 2016-06-25 DIAGNOSIS — F17210 Nicotine dependence, cigarettes, uncomplicated: Secondary | ICD-10-CM | POA: Insufficient documentation

## 2016-06-25 DIAGNOSIS — E039 Hypothyroidism, unspecified: Secondary | ICD-10-CM | POA: Insufficient documentation

## 2016-06-25 DIAGNOSIS — Z79899 Other long term (current) drug therapy: Secondary | ICD-10-CM | POA: Insufficient documentation

## 2016-06-25 DIAGNOSIS — Y9389 Activity, other specified: Secondary | ICD-10-CM | POA: Insufficient documentation

## 2016-06-25 DIAGNOSIS — F902 Attention-deficit hyperactivity disorder, combined type: Secondary | ICD-10-CM | POA: Diagnosis not present

## 2016-06-25 DIAGNOSIS — Y999 Unspecified external cause status: Secondary | ICD-10-CM | POA: Insufficient documentation

## 2016-06-25 DIAGNOSIS — Y9289 Other specified places as the place of occurrence of the external cause: Secondary | ICD-10-CM | POA: Diagnosis not present

## 2016-06-25 DIAGNOSIS — S61512A Laceration without foreign body of left wrist, initial encounter: Secondary | ICD-10-CM | POA: Insufficient documentation

## 2016-06-25 DIAGNOSIS — W268XXA Contact with other sharp object(s), not elsewhere classified, initial encounter: Secondary | ICD-10-CM | POA: Insufficient documentation

## 2016-06-25 DIAGNOSIS — T1491 Suicide attempt: Secondary | ICD-10-CM | POA: Diagnosis present

## 2016-06-25 LAB — COMPREHENSIVE METABOLIC PANEL
ALK PHOS: 95 U/L (ref 38–126)
ALT: 25 U/L (ref 14–54)
AST: 27 U/L (ref 15–41)
Albumin: 4.3 g/dL (ref 3.5–5.0)
Anion gap: 6 (ref 5–15)
BILIRUBIN TOTAL: 0.4 mg/dL (ref 0.3–1.2)
BUN: 12 mg/dL (ref 6–20)
CALCIUM: 9.3 mg/dL (ref 8.9–10.3)
CHLORIDE: 107 mmol/L (ref 101–111)
CO2: 26 mmol/L (ref 22–32)
CREATININE: 0.87 mg/dL (ref 0.44–1.00)
Glucose, Bld: 95 mg/dL (ref 65–99)
Potassium: 3.9 mmol/L (ref 3.5–5.1)
Sodium: 139 mmol/L (ref 135–145)
TOTAL PROTEIN: 7.7 g/dL (ref 6.5–8.1)

## 2016-06-25 LAB — CBC
HCT: 40.1 % (ref 36.0–46.0)
Hemoglobin: 13.7 g/dL (ref 12.0–15.0)
MCH: 27.5 pg (ref 26.0–34.0)
MCHC: 34.2 g/dL (ref 30.0–36.0)
MCV: 80.5 fL (ref 78.0–100.0)
PLATELETS: 364 10*3/uL (ref 150–400)
RBC: 4.98 MIL/uL (ref 3.87–5.11)
RDW: 14.7 % (ref 11.5–15.5)
WBC: 18.1 10*3/uL — ABNORMAL HIGH (ref 4.0–10.5)

## 2016-06-25 LAB — RAPID URINE DRUG SCREEN, HOSP PERFORMED
Amphetamines: POSITIVE — AB
Barbiturates: NOT DETECTED
Benzodiazepines: NOT DETECTED
COCAINE: NOT DETECTED
OPIATES: NOT DETECTED
TETRAHYDROCANNABINOL: NOT DETECTED

## 2016-06-25 LAB — ETHANOL

## 2016-06-25 LAB — SALICYLATE LEVEL

## 2016-06-25 LAB — ACETAMINOPHEN LEVEL: Acetaminophen (Tylenol), Serum: <10 ug/mL — ABNORMAL LOW (ref 10–30)

## 2016-06-25 MED ORDER — LIDOCAINE-EPINEPHRINE (PF) 1 %-1:200000 IJ SOLN
20.0000 mL | Freq: Once | INTRAMUSCULAR | Status: DC
  Filled 2016-06-25: qty 30
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
## 2016-06-25 NOTE — ED Provider Notes (Signed)
[facility] DEPT Provider Note   CSN: [id number] Arrival date & time: 06/25/16  1428     History   Chief Complaint Chief Complaint  Patient presents with  . Suicidal    HPI [name] is a 20 y.o. female with a past medical history of ADD, ODD, PTSD, and a history of cutting who presents with self-inflicted laceration of the wrist and concern for dangerous behavior. Patient brought in by law enforcement and parents. Patient reports that she was an argument with her father last night and this morning however, in order to deal with this, she used a razor blade to slit her left wrist. She reports that it was not a suicide attempt and rather a cutting stress relieving action. She denies suicidal ideation and homicidal ideation. She reports that there was a misunderstanding after the cutting episode because she went to a parking deck where she goes to sit, think, and calm down. According to by standards, the patient had her legs hanging off the side of the parking deck however, patient reports that she was not near the edge. He says this is a place that she goes to calm down. She reports that she has a very positive outlook as she is now back in school and doing well. She reports taking all medications. She reports that her tetanus is up-to-date. She denies any numbness, tingling, weakness in her left hand. She is right-handed.  The history is provided by the patient, medical records and a parent (Event organiser spoke with nursing). No language interpreter was used.  Mental Health Problem  Presenting symptoms: self-mutilation   Presenting symptoms: no agitation, no depression, no hallucinations, no homicidal ideas, no suicidal thoughts, no suicidal threats and no suicide attempt   Patient accompanied by:  Law enforcement and parent Degree of incapacity (severity):  Mild Onset quality:  Gradual Duration:  1 day Timing:  Constant Progression:  Resolved Chronicity:  Recurrent Context:  stressful life event   Treatment compliance:  All of the time Relieved by:  Nothing Worsened by:  Family interactions Associated symptoms: no abdominal pain, no anxiety, no appetite change, no chest pain, no decreased need for sleep, no fatigue and no headaches   Risk factors: hx of mental illness     Past Medical History:  Diagnosis Date  . ADD (attention deficit disorder)   . Allergy   . Anxiety   . Constitutional growth delay   . Depression   . Goiter   . Hashimoto disease   . Headache(784.0)   . Obesity, unspecified 01/18/2013  . ODD (oppositional defiant disorder)   . PTSD (post-traumatic stress disorder)   . Reactive attachment disorder     Patient Active Problem List   Diagnosis Date Noted  . Bipolar I disorder, most recent episode depressed ([place])   . Bipolar 2 disorder, major depressive episode ([place]) 08/20/2014  . Attention deficit hyperactivity disorder (ADHD), combined type, severe 08/17/2014  . Salicylate overdose [id number]  . Suicidal intent   . Obesity, unspecified 01/18/2013  . MDD (major depressive disorder), recurrent episode, moderate ([place]) 07/06/2012  . ADHD (attention deficit hyperactivity disorder), predominantly hyperactive impulsive type 07/06/2012  . Oppositional defiant disorder 07/06/2012  . Reactive attachment disorder of infancy/early childhood, disinhibited 07/06/2012  . PTSD (post-traumatic stress disorder) 07/06/2012  . Goiter   . Hashimoto's thyroiditis   . Other specified acquired hypothyroidism 03/18/2011  . Short stature 03/18/2011    Past Surgical History:  Procedure Laterality Date  . TONSILLECTOMY  age 63yo  . TYMPANOSTOMY TUBE PLACEMENT     BMTT in infancy    OB History    No data available       Home Medications    Prior to Admission medications   Medication Sig Start Date End Date Taking? Authorizing Provider  cloNIDine HCl (KAPVAY) 0.1 MG TB12 ER tablet Take 0.1 mg by mouth 2 (two) times daily.    Yes Historical  Provider, MD  hydrOXYzine (VISTARIL) 25 MG capsule Take 25 mg by mouth daily with breakfast.    Yes Historical Provider, MD  ibuprofen (ADVIL,MOTRIN) 200 MG tablet Take 200 mg by mouth every 6 (six) hours as needed for fever, headache, mild pain, moderate pain or cramping.   Yes Historical Provider, MD  levonorgestrel-ethinyl estradiol (ORSYTHIA) 0.1-20 MG-MCG tablet Take 1 tablet by mouth daily. 08/22/14  Yes Kerrie Buffalo, NP  levothyroxine (SYNTHROID, LEVOTHROID) 150 MCG tablet Take 150 mcg by mouth daily before breakfast.  04/05/16  Yes Historical Provider, MD  lisdexamfetamine (VYVANSE) 60 MG capsule Take 60 mg by mouth daily with breakfast.   Yes Historical Provider, MD  lithium carbonate (ESKALITH) 450 MG CR tablet Take 450 mg by mouth 2 (two) times daily.   Yes Historical Provider, MD  Lurasidone HCl (LATUDA) 120 MG TABS Take 120 mg by mouth every evening.   Yes Historical Provider, MD  Melatonin 3 MG TABS Take 3 mg by mouth at bedtime.   Yes Historical Provider, MD  oxcarbazepine (TRILEPTAL) 600 MG tablet Take 600 mg by mouth 2 (two) times daily.  05/23/16  Yes Historical Provider, MD  prazosin (MINIPRESS) 2 MG capsule Take 2 mg by mouth at bedtime. Reported on 03/12/2016   Yes Historical Provider, MD  sertraline (ZOLOFT) 100 MG tablet Take 100 mg by mouth at bedtime. Reported on 03/12/2016   Yes Historical Provider, MD  lithium carbonate 600 MG capsule Take 1 capsule (600 mg total) by mouth at bedtime. Patient not taking: Reported on 06/25/2016 08/22/14   Kerrie Buffalo, NP  lurasidone 20 MG TABS Take 1 tablet (20 mg total) by mouth at bedtime. Patient not taking: Reported on 06/25/2016 08/22/14   Kerrie Buffalo, NP  nitrofurantoin, macrocrystal-monohydrate, (MACROBID) 100 MG capsule Take 1 capsule (100 mg total) by mouth 2 (two) times daily. Patient not taking: Reported on 06/25/2016 06/15/16   Leonie Douglas, PA-C    Family History Family History  Problem Relation Age of Onset  .  Adopted: Yes  . Family history unknown: Yes    Social History Social History  Substance Use Topics  . Smoking status: Current Every Day Smoker    Packs/day: 1.00    Years: 1.00    Types: Cigarettes  . Smokeless tobacco: Never Used  . Alcohol use No     Allergies   Alprazolam and Nickel   Review of Systems Review of Systems  Constitutional: Negative for appetite change, fatigue and fever.  HENT: Negative for congestion and rhinorrhea.   Respiratory: Negative for chest tightness, shortness of breath, wheezing and stridor.   Cardiovascular: Negative for chest pain.  Gastrointestinal: Negative for abdominal pain, diarrhea, nausea and vomiting.  Genitourinary: Negative for dysuria.  Musculoskeletal: Negative for back pain, neck pain and neck stiffness.  Skin: Positive for wound. Negative for rash.  Neurological: Negative for weakness, light-headedness and headaches.  Psychiatric/Behavioral: Positive for self-injury. Negative for agitation, confusion, hallucinations, homicidal ideas and suicidal ideas. The patient is not nervous/anxious.   All other systems reviewed and are negative.  Physical Exam Updated Vital Signs BP 139/84 (BP Location: Left Arm)   Pulse 95   Temp 98.4 F (36.9 C) (Oral)   Resp 16   LMP 06/12/2016   SpO2 97%   Physical Exam  Constitutional: She is oriented to person, place, and time. She appears well-developed and well-nourished. No distress.  HENT:  Head: Normocephalic and atraumatic.  Mouth/Throat: Oropharynx is clear and moist. No oropharyngeal exudate.  Eyes: Conjunctivae and EOM are normal. Pupils are equal, round, and reactive to light.  Neck: Normal range of motion. Neck supple.  Cardiovascular: Normal rate and regular rhythm.   No murmur heard. Pulmonary/Chest: Effort normal and breath sounds normal. No respiratory distress.  Abdominal: Soft. There is no tenderness.  Musculoskeletal: She exhibits no edema.       Left hand: She  exhibits laceration. She exhibits normal range of motion, no tenderness, normal capillary refill and no swelling. Normal sensation noted. Normal strength noted.       Hands: Laceration   Neurological: She is alert and oriented to person, place, and time. She has normal reflexes. She displays normal reflexes. No cranial nerve deficit. She exhibits normal muscle tone. Coordination normal.  Skin: Skin is warm and dry.  Psychiatric: She has a normal mood and affect. Her speech is normal and behavior is normal. She is not actively hallucinating. She expresses no homicidal and no suicidal ideation. She expresses no suicidal plans and no homicidal plans.  Nursing note and vitals reviewed.    ED Treatments / Results  Labs (all labs ordered are listed, but only abnormal results are displayed) Labs Reviewed  ACETAMINOPHEN LEVEL - Abnormal; Notable for the following:       Result Value   Acetaminophen (Tylenol), Serum <10 (*)    All other components within normal limits  CBC - Abnormal; Notable for the following:    WBC 18.1 (*)    All other components within normal limits  URINE RAPID DRUG SCREEN, HOSP PERFORMED - Abnormal; Notable for the following:    Amphetamines POSITIVE (*)    All other components within normal limits  COMPREHENSIVE METABOLIC PANEL  ETHANOL  SALICYLATE LEVEL    EKG  EKG Interpretation None       Radiology No results found.  Procedures .Marland KitchenLaceration Repair Date/Time: 06/26/2016 12:04 PM Performed by: Courtney Paris Authorized by: Courtney Paris   Consent:    Consent obtained:  Verbal   Consent given by:  Parent and patient   Risks discussed:  Infection, pain, poor cosmetic result, need for additional repair, nerve damage, vascular damage, tendon damage, retained foreign body and poor wound healing   Alternatives discussed:  No treatment and observation Universal protocol:    Procedure explained and questions answered to patient or proxy's  satisfaction: yes     Immediately prior to procedure, a time out was called: yes     Patient identity confirmed:  Verbally with patient and hospital-assigned identification number Anesthesia (see MAR for exact dosages):    Anesthesia method:  Local infiltration   Local anesthetic:  Lidocaine 1% WITH epi Laceration details:    Location:  Shoulder/arm   Shoulder/arm location:  L lower arm   Length (cm):  5   Depth (mm):  3 Repair type:    Repair type:  Simple Pre-procedure details:    Preparation:  Patient was prepped and draped in usual sterile fashion Exploration:    Wound exploration: wound explored through full range of motion and entire depth of  wound probed and visualized     Wound extent: no fascia violation noted, no foreign bodies/material noted, no muscle damage noted, no nerve damage noted and no tendon damage noted     Contaminated: no   Treatment:    Area cleansed with:  Saline   Amount of cleaning:  Extensive   Irrigation solution:  Sterile saline   Irrigation volume:  500cc   Irrigation method:  Syringe Skin repair:    Repair method:  Sutures   Suture size:  6-0   Suture material:  Nylon   Suture technique:  Simple interrupted   Number of sutures:  7 Approximation:    Approximation:  Close   Vermilion border: well-aligned   Post-procedure details:    Dressing:  Antibiotic ointment and adhesive bandage   Patient tolerance of procedure:  Tolerated well, no immediate complications   (including critical care time)  Medications Ordered in ED Medications - No data to display   Initial Impression / Assessment and Plan / ED Course  I have reviewed the triage vital signs and the nursing notes.  Pertinent labs & imaging results that were available during my care of the patient were reviewed by me and considered in my medical decision making (see chart for details).  Clinical Course    Avereigh Grissett is a 20 y.o. female with a past medical history of ADD, ODD,  PTSD, and a history of cutting who presents with self-inflicted laceration of the wrist and concern for dangerous behavior. History and exam are above. No evidence of tendon, nerve, or vascular injury and laceration. Wound was Repaired as described above without complication.   Patient continued to deny SI and HI. Given the conflicting reports of the patient being near the edge of the parking deck versus the area being a safe place, TTS was called for management recommendations.  Laceration repaired, pt is medically cleared for psychiatric evaluation.    Psychiatry evaluate the patient and feel that the patient is not a danger to herself or others at this time. They corroborated with the patient's therapist that the parking deck is a safe place for her to go and destress. The father reports that he agrees the patient is not a danger to herself at this time. The father will take the patient home in the patient will follow up with her psychiatry team.  Patient given instructions on suture removal as well as return precautions were signs and symptoms of infection. Patient appeared well and there was no other complications. Patient understood return precautions for any suicidal ideation. Patient discharged in good condition.   Final Clinical Impressions(s) / ED Diagnoses   Final diagnoses:  Laceration of left wrist, initial encounter    New Prescriptions Discharge Medication List as of 06/25/2016  9:48 PM      Clinical Impression: 1. Laceration of left wrist, initial encounter     Disposition: Discharge  Condition: Good  I have discussed the results, Dx and Tx plan with the pt(& family if present). He/she/they expressed understanding and agree(s) with the plan. Discharge instructions discussed at great length. Strict return precautions discussed and pt &/or family have verbalized understanding of the instructions. No further questions at time of discharge.    Discharge Medication List as  of 06/25/2016  9:48 PM      Follow Up: Maurice Small, MD [address]  [place] [id number] [phone]  Schedule an appointment as soon as possible for a visit  Gwenyth Allegra Tegeler, MD 06/26/16 1239

## 2016-06-25 NOTE — BH Assessment (Signed)
Informed EDP of disposition who is in agreement. Spoke with patient who states that she is not suicidal and continues to contract for safety. Patient states that she is comfortable being discharged home with her parents. Patient states that she has class tomorrow at 10am and she will follow up with her therapist after class. Provided patient with information for Mobile Crisis and the Crisis Text line.    Spoke with patients father who states that he is comfortable with her being discharged to his care. Provided patients father with information for mobile Crisis assistance. patients father directed to patient to discuss the next steps.     Rosalin Hawking, LCSW Therapeutic Triage Specialist [place] 06/25/2016 9:39 PM
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
## 2016-06-25 NOTE — BH Assessment (Signed)
Spoke with patients father separately who states that the patient has been doing well and he is "surpirsed" that she is here for an evaluation.He states that he was previously concerned when the patient was "13 or 14 and she cut that was a stressful time." He states that patient has therapy that was scheduled and the therapist called to reschedule the appointment for tomorrow. Patients father states that patient has been attending therapy and psychiatry appointments regularly. Patients father states that he has no concerns with the patient being discharged. He states that if the patient is discharged he can take off work and will stay with the patient to supervise her safety. He states that he is a professor at [employer] and the patient enjoys school and she will go to class tomorrow and his office is "right next door." He states "I can take the whole week off I need to." Patients father states that if he is not able to be with the patient his wife is available to ensure the patients safety. Counselor offered patients father mobile crisis information and encouraged the father to attend therapy sessions to learn to cope with stressful situations with his daughter.    Rosalin Hawking, LCSW Therapeutic Triage Specialist [place] 06/25/2016 9:04 PM
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
## 2016-06-25 NOTE — Discharge Instructions (Signed)
Please follow-up with your PCP or in urgent care for suture removal in 7-10 days. Please watch for signs and symptoms of infection. If symptoms worsen, please return to the nearest ED. If you start having thoughts of hurting yourself, return to the nearest ED.

## 2016-06-25 NOTE — BH Assessment (Signed)
Assessment completed. Consulted with Patriciaann Clan, PA-C who states that patient does not currently meet inpatient criteria. Patient to be discharged to the care of her father and to follow up with her therapist tomorrow. Patient also has a psychiatrist appointment next week. Counselor will provide family with mobile crisis information.   Rosalin Hawking, LCSW Therapeutic Triage Specialist [place] 06/25/2016 8:53 PM

## 2016-10-30 ENCOUNTER — Ambulatory Visit (INDEPENDENT_AMBULATORY_CARE_PROVIDER_SITE_OTHER): Payer: BC Managed Care – PPO | Admitting: Family Medicine

## 2016-10-30 DIAGNOSIS — J209 Acute bronchitis, unspecified: Secondary | ICD-10-CM | POA: Diagnosis not present

## 2016-10-30 DIAGNOSIS — R0602 Shortness of breath: Secondary | ICD-10-CM

## 2016-10-30 MED ORDER — IPRATROPIUM BROMIDE 0.02 % IN SOLN
0.5000 mg | Freq: Once | RESPIRATORY_TRACT | Status: AC
  Administered 2016-10-30: 0.5 mg via RESPIRATORY_TRACT

## 2016-10-30 MED ORDER — AZITHROMYCIN 250 MG PO TABS: ORAL_TABLET | ORAL | 0 refills | Status: DC

## 2016-10-30 MED ORDER — PREDNISONE 20 MG PO TABS: 40.0000 mg | ORAL_TABLET | Freq: Every day | ORAL | 0 refills | Status: DC

## 2016-10-30 MED ORDER — ALBUTEROL SULFATE 108 (90 BASE) MCG/ACT IN AEPB: 2.0000 | INHALATION_SPRAY | RESPIRATORY_TRACT | 4 refills | Status: DC

## 2016-10-30 MED ORDER — ALBUTEROL SULFATE 108 (90 BASE) MCG/ACT IN AEPB: 2.0000 | INHALATION_SPRAY | RESPIRATORY_TRACT | 4 refills | Status: DC | PRN

## 2016-10-30 MED ORDER — ALBUTEROL SULFATE (2.5 MG/3ML) 0.083% IN NEBU
2.5000 mg | INHALATION_SOLUTION | Freq: Once | RESPIRATORY_TRACT | Status: AC
  Administered 2016-10-30: 2.5 mg via RESPIRATORY_TRACT

## 2016-10-30 MED ORDER — METHYLPREDNISOLONE SODIUM SUCC 125 MG IJ SOLR
62.5000 mg | Freq: Once | INTRAMUSCULAR | Status: AC
  Administered 2016-10-30: 62.5 mg via INTRAVENOUS

## 2016-10-30 NOTE — Progress Notes (Signed)
Patient ID: [name], female    DOB: [birth year], 22 y.o.   MRN: [id number]  PCP: Jeanne Bellows, MD  Chief Complaint  Patient presents with  . Cough  . Nasal Congestion    Subjective:  HPI 71,  21 year old female presents for evaluation of x 4 days of cough with acute shortness of breath. Past medical hx includes: current smoker, thyroid disorder, PTSD, and Bipolar. Illness began as a hacking non productive cough, and over the last 24 hours has progressed to episodes of wheezing  and shortness of breath. Today coughing has been productive with foamy sputum. She reports a metallic taste in mouth. In her chest she reports a "tickling sensation". Headache off and on last few days. Runny nose, with resolution of nasal congestion. No fever, although reports chills and hot flashes today.   Social History   Social History  . Marital status: Single    Spouse name: n/a  . Number of children: 0  . Years of education: N/A   Occupational History  . unemployed    Social History Main Topics  . Smoking status: Current Every Day Smoker    Packs/day: 1.00    Years: 1.00    Types: Cigarettes  . Smokeless tobacco: Never Used  . Alcohol use No  . Drug use: No  . Sexual activity: No     Comment: In a homosexual relationship   Other Topics Concern  . Not on file   Social History Narrative   Lives with adoptive mom, dad and brother. Both [name] and her brother, [name], were adopted from [place]. [name] was adopted at age 50 months. She would like to find her birth mother when she turns 53. Finished high school in 2015.   Took some classes at [employer], looking for work. Meets with a Emergency planning/management officer.    Family History  Problem Relation Age of Onset  . Adopted: Yes  . Family history unknown: Yes   Review of Systems See HPI Patient Active Problem List   Diagnosis Date Noted  . Bipolar I disorder, most recent episode depressed ([place])   . Bipolar 2 disorder,  major depressive episode ([place]) 08/20/2014  . Attention deficit hyperactivity disorder (ADHD), combined type, severe 08/17/2014  . Salicylate overdose [id number]  . Suicidal intent   . Obesity, unspecified 01/18/2013  . MDD (major depressive disorder), recurrent episode, moderate ([place]) 07/06/2012  . ADHD (attention deficit hyperactivity disorder), predominantly hyperactive impulsive type 07/06/2012  . Oppositional defiant disorder 07/06/2012  . Reactive attachment disorder of infancy/early childhood, disinhibited 07/06/2012  . PTSD (post-traumatic stress disorder) 07/06/2012  . Goiter   . Hashimoto's thyroiditis   . Other specified acquired hypothyroidism 03/18/2011  . Short stature 03/18/2011    Allergies  Allergen Reactions  . Alprazolam Other (See Comments)    Makes er very angry and hostile  . Nickel Rash    Prior to Admission medications   Medication Sig Start Date End Date Taking? Authorizing Provider  cloNIDine HCl (KAPVAY) 0.1 MG TB12 ER tablet Take 0.1 mg by mouth 2 (two) times daily.     Historical Provider, MD  hydrOXYzine (VISTARIL) 25 MG capsule Take 25 mg by mouth daily with breakfast.     Historical Provider, MD  ibuprofen (ADVIL,MOTRIN) 200 MG tablet Take 200 mg by mouth every 6 (six) hours as needed for fever, headache, mild pain, moderate pain or cramping.    Historical Provider, MD  levonorgestrel-ethinyl estradiol (ORSYTHIA) 0.1-20 MG-MCG tablet Take  1 tablet by mouth daily. 08/22/14   Jeanne Buffalo, NP  levothyroxine (SYNTHROID, LEVOTHROID) 150 MCG tablet Take 150 mcg by mouth daily before breakfast.  04/05/16   Historical Provider, MD  lisdexamfetamine (VYVANSE) 60 MG capsule Take 60 mg by mouth daily with breakfast.    Historical Provider, MD  lithium carbonate (ESKALITH) 450 MG CR tablet Take 450 mg by mouth 2 (two) times daily.    Historical Provider, MD  lithium carbonate 600 MG capsule Take 1 capsule (600 mg total) by mouth at bedtime. Patient not taking:  Reported on 06/25/2016 08/22/14   Jeanne Buffalo, NP  lurasidone 20 MG TABS Take 1 tablet (20 mg total) by mouth at bedtime. Patient not taking: Reported on 06/25/2016 08/22/14   Jeanne Buffalo, NP  Lurasidone HCl (LATUDA) 120 MG TABS Take 120 mg by mouth every evening.    Historical Provider, MD  Melatonin 3 MG TABS Take 3 mg by mouth at bedtime.    Historical Provider, MD  nitrofurantoin, macrocrystal-monohydrate, (MACROBID) 100 MG capsule Take 1 capsule (100 mg total) by mouth 2 (two) times daily. Patient not taking: Reported on 06/25/2016 06/15/16   Jeanne Douglas, PA-C  oxcarbazepine (TRILEPTAL) 600 MG tablet Take 600 mg by mouth 2 (two) times daily.  05/23/16   Historical Provider, MD  prazosin (MINIPRESS) 2 MG capsule Take 2 mg by mouth at bedtime. Reported on 03/12/2016    Historical Provider, MD  sertraline (ZOLOFT) 100 MG tablet Take 100 mg by mouth at bedtime. Reported on 03/12/2016    Historical Provider, MD  Past Medical, Surgical Family and Social History reviewed and updated.  Objective:  There were no vitals filed for this visit.  Wt Readings from Last 3 Encounters:  06/15/16 169 lb (76.7 kg)  04/13/16 170 lb (77.1 kg)  03/12/16 176 lb (79.8 kg)   Physical Exam  Constitutional: She is oriented to person, place, and time. She appears well-developed and well-nourished.  HENT:  Head: Normocephalic and atraumatic.  Mouth/Throat: Oropharynx is clear and moist.  Neck: Normal range of motion. Neck supple.  Cardiovascular: Normal rate, regular rhythm, normal heart sounds and intact distal pulses.   Pulmonary/Chest: Effort normal. She has decreased breath sounds. She has wheezes in the right upper field, the right middle field, the left upper field and the left middle field.  Diminished air movement bilateral lung fields  Neurological: She is alert and oriented to person, place, and time.  Skin: Skin is warm and dry.  Multiple healed lacerations on arms  Psychiatric: She has a  normal mood and affect. Her behavior is normal. Judgment and thought content normal.     Assessment & Plan:  1. Acute bronchitis, unspecified organism 2. Shortness of breath - albuterol (PROVENTIL) (2.5 MG/3ML) 0.083% nebulizer solution 2.5 mg; Take 3 mLs (2.5 mg total) by nebulization once. - ipratropium (ATROVENT) nebulizer solution 0.5 mg; Take 2.5 mLs (0.5 mg total) by nebulization once. - albuterol (PROVENTIL) (2.5 MG/3ML) 0.083% nebulizer solution 2.5 mg; Take 3 mLs (2.5 mg total) by nebulization once. - ipratropium (ATROVENT) nebulizer solution 0.5 mg; Take 2.5 mLs (0.5 mg total) by nebulization once. - methylPREDNISolone sodium succinate (SOLU-MEDROL) 125 mg/2 mL injection 62.5 mg; Inject 1 mL (62.5 mg total) into the vein once.  -Patient had mild improvement of ventilation post nebulizer treatments, although not in acute distress. -Administered solumedrol injection and recommended hydration and rest for the remainder of the afternoon.   Plan: -Start Prednisone 40 mg with breakfast x 5 days. -  Start Start Azithromycin Take 2 tabs x 1 dose, then 1 tab every day for x 4 days. -Use Albuterol inhaler 2 puffs, every 4-6 hours as needed for shortness of breath -Return for follow-up on Saturday and schedule an appointment with Jaynee Eagles PA-C  Carroll Sage. Kenton Kingfisher, MSN, FNP-C [facility] [place]

## 2016-10-30 NOTE — Patient Instructions (Addendum)
Start Azithromycin Take 2 tabs x 1 dose, then 1 tab every day for x 4 days  Start Prednisone 40 mg with breakfast, once daily for 5 days.  Albuterol 2 puffs every 4-6 hours as needed for shortness of breath.  Avoid smoking and exposure to cigarette smoke.  Return for follow-up on Saturday with Jaynee Eagles -PA-C to evaluate that breathing has improved.   IF you received an x-ray today, you will receive an invoice from [facility]. Please contact [facility] at [phone] with questions or concerns regarding your invoice.   IF you received labwork today, you will receive an invoice from [facility]. Please contact [facility] at [phone] with questions or concerns regarding your invoice.   Our billing staff will not be able to assist you with questions regarding bills from these companies.  You will be contacted with the lab results as soon as they are available. The fastest way to get your results is to activate your My Chart account. Instructions are located on the last page of this paperwork. If you have not heard from [place] regarding the results in 2 weeks, please contact this office.     Acute Bronchitis, Adult Acute bronchitis is sudden (acute) swelling of the air tubes (bronchi) in the lungs. Acute bronchitis causes these tubes to fill with mucus, which can make it hard to breathe. It can also cause coughing or wheezing. In adults, acute bronchitis usually goes away within 2 weeks. A cough caused by bronchitis may last up to 3 weeks. Smoking, allergies, and asthma can make the condition worse. Repeated episodes of bronchitis may cause further lung problems, such as chronic obstructive pulmonary disease (COPD). What are the causes? This condition can be caused by germs and by substances that irritate the lungs, including:  Cold and flu viruses. This condition is most often caused by the same virus that causes a cold.  Bacteria.  Exposure to tobacco smoke, dust, fumes,  and air pollution. What increases the risk? This condition is more likely to develop in people who:  Have close contact with someone with acute bronchitis.  Are exposed to lung irritants, such as tobacco smoke, dust, fumes, and vapors.  Have a weak immune system.  Have a respiratory condition such as asthma. What are the signs or symptoms? Symptoms of this condition include:  A cough.  Coughing up clear, yellow, or green mucus.  Wheezing.  Chest congestion.  Shortness of breath.  A fever.  Body aches.  Chills.  A sore throat. How is this diagnosed? This condition is usually diagnosed with a physical exam. During the exam, your health care provider may order tests, such as chest X-rays, to rule out other conditions. He or she may also:  Test a sample of your mucus for bacterial infection.  Check the level of oxygen in your blood. This is done to check for pneumonia.  Do a chest X-ray or lung function testing to rule out pneumonia and other conditions.  Perform blood tests. Your health care provider will also ask about your symptoms and medical history. How is this treated? Most cases of acute bronchitis clear up over time without treatment. Your health care provider may recommend:  Drinking more fluids. Drinking more makes your mucus thinner, which may make it easier to breathe.  Taking a medicine for a fever or cough.  Taking an antibiotic medicine.  Using an inhaler to help improve shortness of breath and to control a cough.  Using a cool mist vaporizer  or humidifier to make it easier to breathe. Follow these instructions at home: Medicines  Take over-the-counter and prescription medicines only as told by your health care provider.  If you were prescribed an antibiotic, take it as told by your health care provider. Do not stop taking the antibiotic even if you start to feel better. General instructions  Get plenty of rest.  Drink enough fluids to keep  your urine clear or pale yellow.  Avoid smoking and secondhand smoke. Exposure to cigarette smoke or irritating chemicals will make bronchitis worse. If you smoke and you need help quitting, ask your health care provider. Quitting smoking will help your lungs heal faster.  Use an inhaler, cool mist vaporizer, or humidifier as told by your health care provider.  Keep all follow-up visits as told by your health care provider. This is important. How is this prevented? To lower your risk of getting this condition again:  Wash your hands often with soap and water. If soap and water are not available, use hand sanitizer.  Avoid contact with people who have cold symptoms.  Try not to touch your hands to your mouth, nose, or eyes.  Make sure to get the flu shot every year. Contact a health care provider if:  Your symptoms do not improve in 2 weeks of treatment. Get help right away if:  You cough up blood.  You have chest pain.  You have severe shortness of breath.  You become dehydrated.  You faint or keep feeling like you are going to faint.  You keep vomiting.  You have a severe headache.  Your fever or chills gets worse. This information is not intended to replace advice given to you by your health care provider. Make sure you discuss any questions you have with your health care provider. Document Released: 10/24/2004 Document Revised: 04/10/2016 Document Reviewed: 03/06/2016 Elsevier Interactive Patient Education  2017 [employer].

## 2016-11-02 ENCOUNTER — Ambulatory Visit (INDEPENDENT_AMBULATORY_CARE_PROVIDER_SITE_OTHER): Payer: BC Managed Care – PPO | Admitting: Urgent Care

## 2016-11-02 VITALS — BP 114/76 | HR 77 | Temp 98.4°F | Resp 16 | Ht 65.0 in | Wt 171.0 lb

## 2016-11-02 DIAGNOSIS — F17200 Nicotine dependence, unspecified, uncomplicated: Secondary | ICD-10-CM

## 2016-11-02 DIAGNOSIS — R05 Cough: Secondary | ICD-10-CM | POA: Diagnosis not present

## 2016-11-02 DIAGNOSIS — R062 Wheezing: Secondary | ICD-10-CM | POA: Diagnosis not present

## 2016-11-02 DIAGNOSIS — J209 Acute bronchitis, unspecified: Secondary | ICD-10-CM | POA: Diagnosis not present

## 2016-11-02 DIAGNOSIS — R059 Cough, unspecified: Secondary | ICD-10-CM

## 2016-11-02 NOTE — Patient Instructions (Addendum)
Steps to Quit Smoking Smoking tobacco can be harmful to your health and can affect almost every organ in your body. Smoking puts you, and those around you, at risk for developing many serious chronic diseases. Quitting smoking is difficult, but it is one of the best things that you can do for your health. It is never too late to quit. What are the benefits of quitting smoking? When you quit smoking, you lower your risk of developing serious diseases and conditions, such as:  Lung cancer or lung disease, such as COPD.  Heart disease.  Stroke.  Heart attack.  Infertility.  Osteoporosis and bone fractures. Additionally, symptoms such as coughing, wheezing, and shortness of breath may get better when you quit. You may also find that you get sick less often because your body is stronger at fighting off colds and infections. If you are pregnant, quitting smoking can help to reduce your chances of having a baby of low birth weight. How do I get ready to quit? When you decide to quit smoking, create a plan to make sure that you are successful. Before you quit:  Pick a date to quit. Set a date within the next two weeks to give you time to prepare.  Write down the reasons why you are quitting. Keep this list in places where you will see it often, such as on your bathroom mirror or in your car or wallet.  Identify the people, places, things, and activities that make you want to smoke (triggers) and avoid them. Make sure to take these actions:  Throw away all cigarettes at home, at work, and in your car.  Throw away smoking accessories, such as Scientist, research (medical).  Clean your car and make sure to empty the ashtray.  Clean your home, including curtains and carpets.  Tell your family, friends, and coworkers that you are quitting. Support from your loved ones can make quitting easier.  Talk with your health care provider about your options for quitting smoking.  Find out what treatment  options are covered by your health insurance. What strategies can I use to quit smoking? Talk with your healthcare provider about different strategies to quit smoking. Some strategies include:  Quitting smoking altogether instead of gradually lessening how much you smoke over a period of time. Research shows that quitting "cold [place]" is more successful than gradually quitting.  Attending in-person counseling to help you build problem-solving skills. You are more likely to have success in quitting if you attend several counseling sessions. Even short sessions of 10 minutes can be effective.  Finding resources and support systems that can help you to quit smoking and remain smoke-free after you quit. These resources are most helpful when you use them often. They can include:  Online chats with a Social worker.  Telephone quitlines.  Printed Furniture conservator/restorer.  Support groups or group counseling.  Text messaging programs.  Mobile phone applications.  Taking medicines to help you quit smoking. (If you are pregnant or breastfeeding, talk with your health care provider first.) Some medicines contain nicotine and some do not. Both types of medicines help with cravings, but the medicines that include nicotine help to relieve withdrawal symptoms. Your health care provider may recommend:  Nicotine patches, gum, or lozenges.  Nicotine inhalers or sprays.  Non-nicotine medicine that is taken by mouth. Talk with your health care provider about combining strategies, such as taking medicines while you are also receiving in-person counseling. Using these two strategies together makes you more  likely to succeed in quitting than if you used either strategy on its own. If you are pregnant or breastfeeding, talk with your health care provider about finding counseling or other support strategies to quit smoking. Do not take medicine to help you quit smoking unless told to do so by your health care  provider. What things can I do to make it easier to quit? Quitting smoking might feel overwhelming at first, but there is a lot that you can do to make it easier. Take these important actions:  Reach out to your family and friends and ask that they support and encourage you during this time. Call telephone quitlines, reach out to support groups, or work with a counselor for support.  Ask people who smoke to avoid smoking around you.  Avoid places that trigger you to smoke, such as bars, parties, or smoke-break areas at work.  Spend time around people who do not smoke.  Lessen stress in your life, because stress can be a smoking trigger for some people. To lessen stress, try:  Exercising regularly.  Deep-breathing exercises.  Yoga.  Meditating.  Performing a body scan. This involves closing your eyes, scanning your body from head to toe, and noticing which parts of your body are particularly tense. Purposefully relax the muscles in those areas.  Download or purchase mobile phone or tablet apps (applications) that can help you stick to your quit plan by providing reminders, tips, and encouragement. There are many free apps, such as QuitGuide from the [employer] Office manager for Disease Control and Prevention). You can find other support for quitting smoking (smoking cessation) through smokefree.gov and other websites. How will I feel when I quit smoking? Within the first 24 hours of quitting smoking, you may start to feel some withdrawal symptoms. These symptoms are usually most noticeable 2-3 days after quitting, but they usually do not last beyond 2-3 weeks. Changes or symptoms that you might experience include:  Mood swings.  Restlessness, anxiety, or irritation.  Difficulty concentrating.  Dizziness.  Strong cravings for sugary foods in addition to nicotine.  Mild weight gain.  Constipation.  Nausea.  Coughing or a sore throat.  Changes in how your medicines work in your  body.  A depressed mood.  Difficulty sleeping (insomnia). After the first 2-3 weeks of quitting, you may start to notice more positive results, such as:  Improved sense of smell and taste.  Decreased coughing and sore throat.  Slower heart rate.  Lower blood pressure.  Clearer skin.  The ability to breathe more easily.  Fewer sick days. Quitting smoking is very challenging for most people. Do not get discouraged if you are not successful the first time. Some people need to make many attempts to quit before they achieve long-term success. Do your best to stick to your quit plan, and talk with your health care provider if you have any questions or concerns. This information is not intended to replace advice given to you by your health care provider. Make sure you discuss any questions you have with your health care provider. Document Released: 09/10/2001 Document Revised: 05/14/2016 Document Reviewed: 01/31/2015 Elsevier Interactive Patient Education  2017 Elsevier Inc.    Acute Bronchitis, Adult Acute bronchitis is when air tubes (bronchi) in the lungs suddenly get swollen. The condition can make it hard to breathe. It can also cause these symptoms:  A cough.  Coughing up clear, yellow, or green mucus.  Wheezing.  Chest congestion.  Shortness of breath.  A fever.  Body aches.  Chills.  A sore throat. Follow these instructions at home: Medicines  Take over-the-counter and prescription medicines only as told by your doctor.  If you were prescribed an antibiotic medicine, take it as told by your doctor. Do not stop taking the antibiotic even if you start to feel better. General instructions  Rest.  Drink enough fluids to keep your pee (urine) clear or pale yellow.  Avoid smoking and secondhand smoke. If you smoke and you need help quitting, ask your doctor. Quitting will help your lungs heal faster.  Use an inhaler, cool mist vaporizer, or humidifier as told  by your doctor.  Keep all follow-up visits as told by your doctor. This is important. How is this prevented? To lower your risk of getting this condition again:  Wash your hands often with soap and water. If you cannot use soap and water, use hand sanitizer.  Avoid contact with people who have cold symptoms.  Try not to touch your hands to your mouth, nose, or eyes.  Make sure to get the flu shot every year. Contact a doctor if:  Your symptoms do not get better in 2 weeks. Get help right away if:  You cough up blood.  You have chest pain.  You have very bad shortness of breath.  You become dehydrated.  You faint (pass out) or keep feeling like you are going to pass out.  You keep throwing up (vomiting).  You have a very bad headache.  Your fever or chills gets worse. This information is not intended to replace advice given to you by your health care provider. Make sure you discuss any questions you have with your health care provider. Document Released: 03/04/2008 Document Revised: 04/24/2016 Document Reviewed: 03/06/2016 Elsevier Interactive Patient Education  2017 [employer].   Varenicline oral tablets What is this medicine? VARENICLINE (var EN i kleen) is used to help people quit smoking. It can reduce the symptoms caused by stopping smoking. It is used with a patient support program recommended by your physician. This medicine may be used for other purposes; ask your health care provider or pharmacist if you have questions. COMMON BRAND NAME(S): Chantix What should I tell my health care provider before I take this medicine? They need to know if you have any of these conditions: -bipolar disorder, depression, schizophrenia or other mental illness -heart disease -if you often drink alcohol -kidney disease -peripheral vascular disease -seizures -stroke -suicidal thoughts, plans, or attempt; a previous suicide attempt by you or a family member -an unusual or  allergic reaction to varenicline, other medicines, foods, dyes, or preservatives -pregnant or trying to get pregnant -breast-feeding How should I use this medicine? Take this medicine by mouth after eating. Take with a full glass of water. Follow the directions on the prescription label. Take your doses at regular intervals. Do not take your medicine more often than directed. There are 3 ways you can use this medicine to help you quit smoking; talk to your health care professional to decide which plan is right for you: 1) you can choose a quit date and start this medicine 1 week before the quit date, or, 2) you can start taking this medicine before you choose a quit date, and then pick a quit date between day 8 and 35 days of treatment, or, 3) if you are not sure that you are able or willing to quit smoking right away, start taking this medicine and slowly decrease the amount you smoke  as directed by your health care professional with the goal of being cigarette-free by week 12 of treatment. Stick to your plan; ask about support groups or other ways to help you remain cigarette-free. If you are motivated to quit smoking and did not succeed during a previous attempt with this medicine for reasons other than side effects, or if you returned to smoking after this treatment, speak with your health care professional about whether another course of this medicine may be right for you. A special MedGuide will be given to you by the pharmacist with each prescription and refill. Be sure to read this information carefully each time. Talk to your pediatrician regarding the use of this medicine in children. This medicine is not approved for use in children. Overdosage: If you think you have taken too much of this medicine contact a poison control center or emergency room at once. NOTE: This medicine is only for you. Do not share this medicine with others. What if I miss a dose? If you miss a dose, take it as soon  as you can. If it is almost time for your next dose, take only that dose. Do not take double or extra doses. What may interact with this medicine? -alcohol or any product that contains alcohol -insulin -other stop smoking aids -theophylline -warfarin This list may not describe all possible interactions. Give your health care provider a list of all the medicines, herbs, non-prescription drugs, or dietary supplements you use. Also tell them if you smoke, drink alcohol, or use illegal drugs. Some items may interact with your medicine. What should I watch for while using this medicine? Visit your doctor or health care professional for regular check ups. Ask for ongoing advice and encouragement from your doctor or healthcare professional, friends, and family to help you quit. If you smoke while on this medication, quit again Your mouth may get dry. Chewing sugarless gum or sucking hard candy, and drinking plenty of water may help. Contact your doctor if the problem does not go away or is severe. You may get drowsy or dizzy. Do not drive, use machinery, or do anything that needs mental alertness until you know how this medicine affects you. Do not stand or sit up quickly, especially if you are an older patient. This reduces the risk of dizzy or fainting spells. Sleepwalking can happen during treatment with this medicine, and can sometimes lead to behavior that is harmful to you, other people, or property. Stop taking this medicine and tell your doctor if you start sleepwalking or have other unusual sleep-related activity. Decrease the amount of alcoholic beverages that you drink during treatment with this medicine until you know if this medicine affects your ability to tolerate alcohol. Some people have experienced increased drunkenness (intoxication), unusual or sometimes aggressive behavior, or no memory of things that have happened (amnesia) during treatment with this medicine. The use of this medicine  may increase the chance of suicidal thoughts or actions. Pay special attention to how you are responding while on this medicine. Any worsening of mood, or thoughts of suicide or dying should be reported to your health care professional right away. What side effects may I notice from receiving this medicine? Side effects that you should report to your doctor or health care professional as soon as possible: -allergic reactions like skin rash, itching or hives, swelling of the face, lips, tongue, or throat -acting aggressive, being angry or violent, or acting on dangerous impulses -breathing problems -changes in vision -  chest pain or chest tightness -confusion, trouble speaking or understanding -new or worsening depression, anxiety, or panic attacks -extreme increase in activity and talking (mania) -fast, irregular heartbeat -feeling faint or lightheaded, falls -fever -pain in legs when walking -problems with balance, talking, walking -redness, blistering, peeling or loosening of the skin, including inside the mouth -ringing in ears -seeing or hearing things that aren't there (hallucinations) -seizures -sleepwalking -sudden numbness or weakness of the face, arm or leg -thoughts about suicide or dying, or attempts to commit suicide -trouble passing urine or change in the amount of urine -unusual bleeding or bruising -unusually weak or tired Side effects that usually do not require medical attention (report to your doctor or health care professional if they continue or are bothersome): -constipation -headache -nausea, vomiting -strange dreams -stomach gas -trouble sleeping This list may not describe all possible side effects. Call your doctor for medical advice about side effects. You may report side effects to FDA at 1-800-FDA-1088. Where should I keep my medicine? Keep out of the reach of children. Store at room temperature between 15 and 30 degrees C (59 and 86 degrees F). Throw  away any unused medicine after the expiration date. NOTE: This sheet is a summary. It may not cover all possible information. If you have questions about this medicine, talk to your doctor, pharmacist, or health care provider.  2017 Elsevier/Gold Standard (2015-06-01 16:14:23)   IF you received an x-ray today, you will receive an invoice from [facility]. Please contact [facility] at [phone] with questions or concerns regarding your invoice.   IF you received labwork today, you will receive an invoice from [place]. Please contact [facility] at [phone] with questions or concerns regarding your invoice.   Our billing staff will not be able to assist you with questions regarding bills from these companies.  You will be contacted with the lab results as soon as they are available. The fastest way to get your results is to activate your My Chart account. Instructions are located on the last page of this paperwork. If you have not heard from [place] regarding the results in 2 weeks, please contact this office.
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
## 2016-12-24 ENCOUNTER — Ambulatory Visit: Payer: BC Managed Care – PPO

## 2017-04-22 ENCOUNTER — Ambulatory Visit (INDEPENDENT_AMBULATORY_CARE_PROVIDER_SITE_OTHER): Payer: BC Managed Care – PPO | Admitting: Physician Assistant

## 2017-04-22 ENCOUNTER — Encounter: Payer: Self-pay | Admitting: Physician Assistant

## 2017-04-22 VITALS — BP 128/71 | HR 41 | Temp 98.8°F | Resp 18 | Ht 63.82 in | Wt 170.6 lb

## 2017-04-22 DIAGNOSIS — N898 Other specified noninflammatory disorders of vagina: Secondary | ICD-10-CM

## 2017-04-22 DIAGNOSIS — B379 Candidiasis, unspecified: Secondary | ICD-10-CM

## 2017-04-22 DIAGNOSIS — T3695XA Adverse effect of unspecified systemic antibiotic, initial encounter: Secondary | ICD-10-CM

## 2017-04-22 DIAGNOSIS — L298 Other pruritus: Secondary | ICD-10-CM | POA: Diagnosis not present

## 2017-04-22 MED ORDER — FLUCONAZOLE 150 MG PO TABS: 150.0000 mg | ORAL_TABLET | Freq: Once | ORAL | 0 refills | Status: AC

## 2017-04-22 MED ORDER — METRONIDAZOLE 500 MG PO TABS: 500.0000 mg | ORAL_TABLET | Freq: Two times a day (BID) | ORAL | 0 refills | Status: DC

## 2017-04-22 NOTE — Patient Instructions (Addendum)
Take diflucan today. This should help with your vaginal itching. If your discharge and ithciness are not fully improved in 48 hours, please pick up flagyl and take that for any underlying bacterial vaginosis. If symptoms persist, please return to clinic. Thank you for letting me participate in your health and well being.    Vaginal Yeast infection, Adult Vaginal yeast infection is a condition that causes soreness, swelling, and redness (inflammation) of the vagina. It also causes vaginal discharge. This is a common condition. Some women get this infection frequently. What are the causes? This condition is caused by a change in the normal balance of the yeast (candida) and bacteria that live in the vagina. This change causes an overgrowth of yeast, which causes the inflammation. What increases the risk? This condition is more likely to develop in:  Women who take antibiotic medicines.  Women who have diabetes.  Women who take birth control pills.  Women who are pregnant.  Women who douche often.  Women who have a weak defense (immune) system.  Women who have been taking steroid medicines for a long time.  Women who frequently wear tight clothing.  What are the signs or symptoms? Symptoms of this condition include:  White, thick vaginal discharge.  Swelling, itching, redness, and irritation of the vagina. The lips of the vagina (vulva) may be affected as well.  Pain or a burning feeling while urinating.  Pain during sex.  How is this diagnosed? This condition is diagnosed with a medical history and physical exam. This will include a pelvic exam. Your health care provider will examine a sample of your vaginal discharge under a microscope. Your health care provider may send this sample for testing to confirm the diagnosis. How is this treated? This condition is treated with medicine. Medicines may be over-the-counter or prescription. You may be told to use one or more of the  following:  Medicine that is taken orally.  Medicine that is applied as a cream.  Medicine that is inserted directly into the vagina (suppository).  Follow these instructions at home:  Take or apply over-the-counter and prescription medicines only as told by your health care provider.  Do not have sex until your health care provider has approved. Tell your sex partner that you have a yeast infection. That person should go to his or her health care provider if he or she develops symptoms.  Do not wear tight clothes, such as pantyhose or tight pants.  Avoid using tampons until your health care provider approves.  Eat more yogurt. This may help to keep your yeast infection from returning.  Try taking a sitz bath to help with discomfort. This is a warm water bath that is taken while you are sitting down. The water should only come up to your hips and should cover your buttocks. Do this 3-4 times per day or as told by your health care provider.  Do not douche.  Wear breathable, cotton underwear.  If you have diabetes, keep your blood sugar levels under control. Contact a health care provider if:  You have a fever.  Your symptoms go away and then return.  Your symptoms do not get better with treatment.  Your symptoms get worse.  You have new symptoms.  You develop blisters in or around your vagina.  You have blood coming from your vagina and it is not your menstrual period.  You develop pain in your abdomen. This information is not intended to replace advice given to you  by your health care provider. Make sure you discuss any questions you have with your health care provider. Document Released: 06/26/2005 Document Revised: 02/28/2016 Document Reviewed: 03/20/2015 Elsevier Interactive Patient Education  2018 [employer].   IF you received an x-ray today, you will receive an invoice from [facility]. Please contact [facility] at [phone] with  questions or concerns regarding your invoice.   IF you received labwork today, you will receive an invoice from [place]. Please contact [facility] at [phone] with questions or concerns regarding your invoice.   Our billing staff will not be able to assist you with questions regarding bills from these companies.  You will be contacted with the lab results as soon as they are available. The fastest way to get your results is to activate your My Chart account. Instructions are located on the last page of this paperwork. If you have not heard from [place] regarding the results in 2 weeks, please contact this office.
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
## 2017-05-12 ENCOUNTER — Ambulatory Visit (INDEPENDENT_AMBULATORY_CARE_PROVIDER_SITE_OTHER): Payer: BC Managed Care – PPO | Admitting: Physician Assistant

## 2017-05-12 ENCOUNTER — Ambulatory Visit (INDEPENDENT_AMBULATORY_CARE_PROVIDER_SITE_OTHER): Payer: BC Managed Care – PPO

## 2017-05-12 ENCOUNTER — Encounter: Payer: Self-pay | Admitting: Physician Assistant

## 2017-05-12 VITALS — BP 122/80 | HR 76 | Temp 98.0°F | Resp 18 | Ht 63.82 in | Wt 164.4 lb

## 2017-05-12 DIAGNOSIS — R062 Wheezing: Secondary | ICD-10-CM | POA: Diagnosis not present

## 2017-05-12 DIAGNOSIS — R059 Cough, unspecified: Secondary | ICD-10-CM

## 2017-05-12 DIAGNOSIS — J209 Acute bronchitis, unspecified: Secondary | ICD-10-CM | POA: Diagnosis not present

## 2017-05-12 DIAGNOSIS — R05 Cough: Secondary | ICD-10-CM

## 2017-05-12 LAB — POCT CBC
Granulocyte percent: 75.4 %G (ref 37–80)
HCT, POC: 45 % (ref 37.7–47.9)
HEMOGLOBIN: 15.2 g/dL (ref 12.2–16.2)
LYMPH, POC: 2.5 (ref 0.6–3.4)
MCH, POC: 28.7 pg (ref 27–31.2)
MCHC: 33.8 g/dL (ref 31.8–35.4)
MCV: 84.8 fL (ref 80–97)
MID (cbc): 1.1 — AB (ref 0–0.9)
MPV: 7.2 fL (ref 0–99.8)
POC Granulocyte: 11.1 — AB (ref 2–6.9)
POC LYMPH PERCENT: 16.8 %L (ref 10–50)
POC MID %: 7.8 %M (ref 0–12)
Platelet Count, POC: 317 10*3/uL (ref 142–424)
RBC: 5.31 M/uL (ref 4.04–5.48)
RDW, POC: 13.3 %
WBC: 14.7 10*3/uL — AB (ref 4.6–10.2)

## 2017-05-12 MED ORDER — AZITHROMYCIN 250 MG PO TABS: ORAL_TABLET | ORAL | 0 refills | Status: DC

## 2017-05-12 MED ORDER — PREDNISONE 20 MG PO TABS: ORAL_TABLET | ORAL | 0 refills | Status: DC

## 2017-05-12 MED ORDER — IPRATROPIUM BROMIDE 0.02 % IN SOLN
0.5000 mg | Freq: Once | RESPIRATORY_TRACT | Status: AC
  Administered 2017-05-12: 0.5 mg via RESPIRATORY_TRACT

## 2017-05-12 MED ORDER — ALBUTEROL SULFATE (2.5 MG/3ML) 0.083% IN NEBU
2.5000 mg | INHALATION_SOLUTION | Freq: Once | RESPIRATORY_TRACT | Status: AC
  Administered 2017-05-12: 2.5 mg via RESPIRATORY_TRACT

## 2017-05-12 MED ORDER — ALBUTEROL SULFATE HFA 108 (90 BASE) MCG/ACT IN AERS: 2.0000 | INHALATION_SPRAY | Freq: Four times a day (QID) | RESPIRATORY_TRACT | 0 refills | Status: DC | PRN

## 2017-05-12 MED ORDER — METHYLPREDNISOLONE SODIUM SUCC 125 MG IJ SOLR
60.0000 mg | Freq: Once | INTRAMUSCULAR | Status: AC
  Administered 2017-05-12: 60 mg via INTRAMUSCULAR

## 2017-05-12 NOTE — Progress Notes (Signed)
MRN: [id number] DOB: June 07, 1996  Subjective:   [name] is a 21 y.o. female presenting for chief complaint of Cough (x2 days, pt's mom states pt is having hard time breathing. Pt states she has nasal congestion a sore throat.) .  Reports 2 day history of dry cough (no hempotysis), wheezing, nasal congestin, sore throat, and chest congestion.  Has tried dayquil and nyquil with no full relief. Denies fever, sinus pain, ear pain, chest pain and myalgia, fatigue, nausea, vomiting, abdominal pain and diarrhea. Has had sick contact with brother who has similar coughing symptoms just not as bad. No history of seasonal allergies, no history of asthma.. Current every day smoker, smokes a pack a day, has not been able to smoke much since the cough started. No alcohol use. No recent travel outside of the country. Denies any other aggravating or relieving factors, no other questions or concerns.  [name] has a current medication list which includes the following prescription(s): amphetamine-dextroamphetamine, clonidine hcl, ibuprofen, levothyroxine, lithium carbonate, lurasidone, melatonin, metronidazole, oxcarbazepine, prazosin, sertraline, albuterol, azithromycin, hydroxyzine, levonorgestrel-ethinyl estradiol, and prednisone, and the following Facility-Administered Medications: methylprednisolone sodium succinate. Also is allergic to alprazolam and nickel.  [name]  has a past medical history of ADD (attention deficit disorder); Allergy; Anxiety; Constitutional growth delay; Depression; Goiter; Hashimoto disease; Headache(784.0); Obesity, unspecified (01/18/2013); ODD (oppositional defiant disorder); PTSD (post-traumatic stress disorder); and Reactive attachment disorder. Also  has a past surgical history that includes Tonsillectomy and Tympanostomy tube placement.   Objective:   Vitals: BP 122/80 (BP Location: Right Arm, Patient Position: Sitting, Cuff Size: Normal)   Pulse 76   Temp 98 F (36.7 C)  (Oral)   Resp 18   Ht 5' 3.82" (1.621 m)   Wt 164 lb 6.4 oz (74.6 kg)   LMP 12/29/2016   SpO2 96%   BMI 28.38 kg/m   Physical Exam  Constitutional: She is oriented to person, place, and time. She appears well-developed and well-nourished. No distress.  HENT:  Head: Normocephalic and atraumatic.  Right Ear: External ear and ear canal normal. Tympanic membrane is scarred. Tympanic membrane is not erythematous and not bulging.  Left Ear: External ear and ear canal normal. Tympanic membrane is scarred. Tympanic membrane is not erythematous and not bulging.  Nose: Mucosal edema (mild bilaterally) and rhinorrhea present. Right sinus exhibits no maxillary sinus tenderness and no frontal sinus tenderness. Left sinus exhibits no maxillary sinus tenderness and no frontal sinus tenderness.  Mouth/Throat: Uvula is midline, oropharynx is clear and moist and mucous membranes are normal. No tonsillar exudate.  Eyes: Conjunctivae are normal.  Neck: Normal range of motion.  Cardiovascular: Normal rate, regular rhythm and normal heart sounds.   Pulmonary/Chest: Effort normal. She has wheezes ( diffuse throughout bilateral lung fields). She has no rhonchi. She has no rales.  Lymphadenopathy:       Head (right side): No submental, no submandibular, no tonsillar, no preauricular, no posterior auricular and no occipital adenopathy present.       Head (left side): No submental, no submandibular, no tonsillar, no preauricular, no posterior auricular and no occipital adenopathy present.    She has no cervical adenopathy.       Right: No supraclavicular adenopathy present.       Left: No supraclavicular adenopathy present.  Neurological: She is alert and oriented to person, place, and time.  Skin: Skin is warm and dry.  Multiple scars notes on bilateral forearms.   Psychiatric: She has a normal mood and affect.  Vitals reviewed.   Results for orders placed or performed in visit on 05/12/17 (from the past 24  hour(s))  POCT CBC     Status: Abnormal   Collection Time: 05/12/17 12:39 PM  Result Value Ref Range   WBC 14.7 (A) 4.6 - 10.2 K/uL   Lymph, poc 2.5 0.6 - 3.4   POC LYMPH PERCENT 16.8 10 - 50 %L   MID (cbc) 1.1 (A) 0 - 0.9   POC MID % 7.8 0 - 12 %M   POC Granulocyte 11.1 (A) 2 - 6.9   Granulocyte percent 75.4 37 - 80 %G   RBC 5.31 4.04 - 5.48 M/uL   Hemoglobin 15.2 12.2 - 16.2 g/dL   HCT, POC 45.0 37.7 - 47.9 %   MCV 84.8 80 - 97 fL   MCH, POC 28.7 27 - 31.2 pg   MCHC 33.8 31.8 - 35.4 g/dL   RDW, POC 13.3 %   Platelet Count, POC 317 142 - 424 K/uL   MPV 7.2 0 - 99.8 fL     Dg Chest 2 View  Result Date: 05/12/2017 CLINICAL DATA:  cough x 2 days, diffuse wheezing noted on exam EXAM: CHEST  2 VIEW COMPARISON:  None. FINDINGS: The heart size and mediastinal contours are within normal limits. Both lungs are clear. The visualized skeletal structures are unremarkable. IMPRESSION: No active cardiopulmonary disease. Electronically Signed   By: Kathreen Devoid   On: 05/12/2017 12:57   Post duoneb, wheezing has improved. SpO2 improved from 93% to 96%. Pt notes she feels less congested.  Assessment and Plan :  1. Wheezing -Improved in office but not completely resolved.  - albuterol (PROVENTIL) (2.5 MG/3ML) 0.083% nebulizer solution 2.5 mg; Take 3 mLs (2.5 mg total) by nebulization once. - ipratropium (ATROVENT) nebulizer solution 0.5 mg; Take 2.5 mLs (0.5 mg total) by nebulization once. - POCT CBC - albuterol (PROVENTIL) (2.5 MG/3ML) 0.083% nebulizer solution 2.5 mg; Take 3 mLs (2.5 mg total) by nebulization once. - ipratropium (ATROVENT) nebulizer solution 0.5 mg; Take 2.5 mLs (0.5 mg total) by nebulization once. - methylPREDNISolone sodium succinate (SOLU-MEDROL) 125 mg/2 mL injection 60 mg; Inject 0.96 mLs (60 mg total) into the muscle once. - DG Chest 2 View; Future 2. Cough - POCT CBC 3. Bronchitis with bronchospasm Due to hx and PE findings will treat for underlying bacterial  etiology and for symptomatic relief. Pt's wheezing has improved, but is still present. She appears stable and in no acute distress. Vitals are stable. Pt encouraged to follow up in 2 days for reevaluation.  - predniSONE (DELTASONE) 20 MG tablet; Take 3 PO QAM x3days, 2 PO QAM x3days, 1 PO QAM x3days  Dispense: 18 tablet; Refill: 0 - azithromycin (ZITHROMAX) 250 MG tablet; Take 2 tabs PO x 1 dose, then 1 tab PO QD x 4 days  Dispense: 6 tablet; Refill: 0 - albuterol (PROVENTIL HFA;VENTOLIN HFA) 108 (90 Base) MCG/ACT inhaler; Inhale 2 puffs into the lungs every 6 (six) hours as needed for wheezing or shortness of breath.  Dispense: 1 Inhaler; Refill: 0  Tenna Delaine, PA-C  [facility] 05/12/2017 1:57 PM

## 2017-05-12 NOTE — Patient Instructions (Addendum)
We are going to treat this potential underlying bacterial etiology with a z pack, please take as prescribed. Start antibiotic today.   We are going to treat your underlying inflammation with oral prednisone. Start the oral prednisone tomorrow.  Prednisone is a steroid and can cause side effects such as headache, irritability, nausea, vomiting, increased heart rate, increased blood pressure, increased blood sugar, appetite changes, and insomnia. Please take tablets in the morning with a full meal to help decrease the chances of these side effects.  You may use albuterol inhaler every 4-6 hours as needed for wheezing.   I recommend following up in 2 days in office for reevaluation. Return sooner if symptoms worsen. Thank you for letting me participate in your health and well being.    Bronchospasm, Adult Bronchospasm is when airways in the lungs get smaller. When this happens, it can be hard to breathe. You may cough. You may also make a whistling sound when you breathe (wheeze). Follow these instructions at home: Medicines  Take over-the-counter and prescription medicines only as told by your doctor.  If you need to use an inhaler or nebulizer to take your medicine, ask your doctor how to use it.  If you were given a spacer, always use it with your inhaler. Lifestyle  Change your heating and air conditioning filter. Do this at least once a month.  Try not to use fireplaces and wood stoves.  Do not  smoke. Do not  allow smoking in your home.  Try not to use things that have a strong smell, like perfume.  Get rid of pests (such as roaches and mice) and their poop.  Remove any mold from your home.  Keep your house clean. Get rid of dust.  Use cleaning products that have no smell.  Replace carpet with wood, tile, or vinyl flooring.  Use allergy-proof pillows, mattress covers, and box spring covers.  Wash bed sheets and blankets every week. Use hot water. Dry them in a  dryer.  Use blankets that are made of polyester or cotton.  Wash your hands often.  Keep pets out of your bedroom.  When you exercise, try not to breathe in cold air. General instructions  Have a plan for getting medical care. Know these things: ? When to call your doctor. ? When to call local emergency services (911 in the [place] ? Where to go in an emergency.  Stay up to date on your shots (immunizations).  When you have an episode: ? Stay calm. ? Relax. ? Breathe slowly. Contact a doctor if:  Your muscles ache.  Your chest hurts.  The color of the mucus you cough up (sputum) changes from clear or white to yellow, green, gray, or bloody.  The mucus you cough up gets thicker.  You have a fever. Get help right away if:  The whistling sound gets worse, even after you take your medicines.  Your coughing gets worse.  You find it even harder to breathe.  Your chest hurts very much. Summary  Bronchospasm is when airways in the lungs get smaller.  When this happens, it can be hard to breathe. You may cough. You may also make a whistling sound when you breathe.  Stay away from things that cause you to have episodes. These include smoke or dust. This information is not intended to replace advice given to you by your health care provider. Make sure you discuss any questions you have with your health care provider. Document Released: 07/14/2009  Document Revised: 09/19/2016 Document Reviewed: 09/19/2016 Elsevier Interactive Patient Education  2017 [employer].   IF you received an x-ray today, you will receive an invoice from [facility]. Please contact [facility] at [phone] with questions or concerns regarding your invoice.   IF you received labwork today, you will receive an invoice from [place]. Please contact [facility] at [phone] with questions or concerns regarding your invoice.   Our billing staff will not be able to assist you with  questions regarding bills from these companies.  You will be contacted with the lab results as soon as they are available. The fastest way to get your results is to activate your My Chart account. Instructions are located on the last page of this paperwork. If you have not heard from [place] regarding the results in 2 weeks, please contact this office.

## 2017-05-23 ENCOUNTER — Ambulatory Visit (INDEPENDENT_AMBULATORY_CARE_PROVIDER_SITE_OTHER): Payer: BC Managed Care – PPO

## 2017-05-23 ENCOUNTER — Ambulatory Visit (INDEPENDENT_AMBULATORY_CARE_PROVIDER_SITE_OTHER): Payer: BC Managed Care – PPO | Admitting: Family Medicine

## 2017-05-23 ENCOUNTER — Encounter: Payer: Self-pay | Admitting: Family Medicine

## 2017-05-23 VITALS — BP 120/74 | HR 78 | Temp 99.3°F | Resp 16 | Ht 63.75 in | Wt 164.6 lb

## 2017-05-23 DIAGNOSIS — M25562 Pain in left knee: Secondary | ICD-10-CM

## 2017-05-23 NOTE — Progress Notes (Signed)
8/24/20185:25 PM  [name] 07-14-1996, 21 y.o. female [id number]  Chief Complaint  Patient presents with  . left leg    numbness a little above knee down the leg x 1 wk    HPI:   Patient is a 21 y.o. female who presents today with her mother for 3 weeks of intermittent left knee pain, lateral side, sharp shooting radiates down her leg, has been limping as weight bearing or fully extending is painful. Ibuprofen and ice not helping much. Feels left foot is cold. No numbness or pain in left foot. Symptoms started after she was jumping but denies any acute injury or twisting.  Depression screen [facility] 2/9 05/23/2017 05/12/2017 04/22/2017  Decreased Interest 0 0 0  Down, Depressed, Hopeless 0 0 0  PHQ - 2 Score 0 0 0  Altered sleeping - - -  Tired, decreased energy - - -  Change in appetite - - -  Feeling bad or failure about yourself  - - -  Trouble concentrating - - -  Moving slowly or fidgety/restless - - -  Suicidal thoughts - - -  PHQ-9 Score - - -  Difficult doing work/chores - - -    Allergies  Allergen Reactions  . Alprazolam Other (See Comments)    Makes er very angry and hostile  . Nickel Rash    Current Outpatient Prescriptions on File Prior to Visit  Medication Sig Dispense Refill  . amphetamine-dextroamphetamine (ADDERALL XR) 30 MG 24 hr capsule Take 30 mg by mouth daily.    Marland Kitchen levothyroxine (SYNTHROID, LEVOTHROID) 150 MCG tablet Take 150 mcg by mouth daily before breakfast.     . lithium carbonate (ESKALITH) 450 MG CR tablet Take 450 mg by mouth 2 (two) times daily.    Marland Kitchen lurasidone (LATUDA) 20 MG TABS tablet Take 20 mg by mouth.    . Melatonin 3 MG TABS Take 3 mg by mouth at bedtime.    . prazosin (MINIPRESS) 2 MG capsule Take 2 mg by mouth at bedtime. Reported on 03/12/2016    . sertraline (ZOLOFT) 100 MG tablet Take 100 mg by mouth at bedtime. Reported on 03/12/2016    . albuterol (PROVENTIL HFA;VENTOLIN HFA) 108 (90 Base) MCG/ACT inhaler Inhale 2 puffs into  the lungs every 6 (six) hours as needed for wheezing or shortness of breath. (Patient not taking: Reported on 05/23/2017) 1 Inhaler 0  . cloNIDine HCl (KAPVAY) 0.1 MG TB12 ER tablet Take 0.1 mg by mouth 2 (two) times daily.     . hydrOXYzine (VISTARIL) 25 MG capsule Take 25 mg by mouth daily with breakfast.     . ibuprofen (ADVIL,MOTRIN) 200 MG tablet Take 200 mg by mouth every 6 (six) hours as needed for fever, headache, mild pain, moderate pain or cramping.    Marland Kitchen levonorgestrel-ethinyl estradiol (ORSYTHIA) 0.1-20 MG-MCG tablet Take 1 tablet by mouth daily. (Patient not taking: Reported on 05/12/2017) 1 Package 11  . oxcarbazepine (TRILEPTAL) 600 MG tablet Take 600 mg by mouth 2 (two) times daily.      No current facility-administered medications on file prior to visit.     Past Medical History:  Diagnosis Date  . ADD (attention deficit disorder)   . Allergy   . Anxiety   . Constitutional growth delay   . Depression   . Goiter   . Hashimoto disease   . Headache(784.0)   . Obesity, unspecified 01/18/2013  . ODD (oppositional defiant disorder)   . PTSD (post-traumatic stress disorder)   .  Reactive attachment disorder     Past Surgical History:  Procedure Laterality Date  . TONSILLECTOMY     age 47yo  . TYMPANOSTOMY TUBE PLACEMENT     BMTT in infancy    Social History  Substance Use Topics  . Smoking status: Current Every Day Smoker    Packs/day: 1.00    Years: 1.00    Types: Cigarettes  . Smokeless tobacco: Never Used  . Alcohol use No    Family History  Problem Relation Age of Onset  . Adopted: Yes  . Family history unknown: Yes    Review of Systems  Musculoskeletal: Positive for joint pain.  Neurological: Negative for tingling and focal weakness.     OBJECTIVE:  Blood pressure 120/74, pulse 78, temperature 99.3 F (37.4 C), temperature source Oral, resp. rate 16, height 5' 3.75" (1.619 m), weight 164 lb 9.6 oz (74.7 kg), last menstrual period 05/18/2017, SpO2 99  %.  Physical Exam  Constitutional: She is well-developed, well-nourished, and in no distress.  Cardiovascular: Intact distal pulses.   Musculoskeletal:  Right knee: normal Left knee: pain with full extension, no swelling, patella non-bloatable, tender along lateral joint line, neg ant/post drawer, neg valgus/varus stress, post mcmurry Left ankle: FROM, no edema, swelling  Neurological: She has normal reflexes. She displays no weakness. No sensory deficit. Gait (favoring left side) abnormal.      ASSESSMENT and PLAN:  1. Left lateral knee pain Exam suggestive of meniscal injury. Discussed RICE therapy, hinge knee brace applied. Xray negative. Next steps in mgt pending MRI results. RTC precautions given. - Apply hinge knee brace - DG Knee 1-2 Views Left - negative - MR Knee Left  Wo Contrast; Future - pending       Rutherford Guys, MD Primary Care at [place], [place] [address] Ph.  [phone] Fax [phone]

## 2017-05-23 NOTE — Patient Instructions (Signed)
     IF you received an x-ray today, you will receive an invoice from [facility]. Please contact [facility] at [phone] with questions or concerns regarding your invoice.   IF you received labwork today, you will receive an invoice from [facility]. Please contact [facility] at [phone] with questions or concerns regarding your invoice.   Our billing staff will not be able to assist you with questions regarding bills from these companies.  You will be contacted with the lab results as soon as they are available. The fastest way to get your results is to activate your My Chart account. Instructions are located on the last page of this paperwork. If you have not heard from us regarding the results in 2 weeks, please contact this office.     

## 2017-05-28 ENCOUNTER — Telehealth: Payer: Self-pay | Admitting: Family Medicine

## 2017-05-28 DIAGNOSIS — M25562 Pain in left knee: Secondary | ICD-10-CM

## 2017-05-28 NOTE — Telephone Encounter (Signed)
Initiated prior auth for pt MRI knee left w/o contrast. It did not meet medical necessity and is under review with [employer]. The case with the medical determination is to close on 05/30/17. I will update on status once determination has been made.

## 2017-05-29 NOTE — Telephone Encounter (Signed)
Noted, please inform patient and her mother about progress. thanks

## 2017-05-29 NOTE — Telephone Encounter (Signed)
Pt notified of status. Will let pt know what determination ends up being. Thanks!

## 2017-06-03 NOTE — Telephone Encounter (Signed)
[employer] faxed decision regarding MRI prior auth. They have not approved the request due to information not being provided of what type of knee problem is suspected. A peer to peer can be initiated for this by calling [employer] at [phone]. Other options including a physician courtesy review and instructions on how to start an appeal can be found in fax placed in provider's box. Thanks!

## 2017-06-04 NOTE — Telephone Encounter (Signed)
Spoke with patient, discussed MRI denial. She opted for ortho referral.

## 2017-07-22 ENCOUNTER — Ambulatory Visit: Payer: BC Managed Care – PPO | Admitting: Family Medicine

## 2017-07-26 ENCOUNTER — Ambulatory Visit: Payer: BC Managed Care – PPO | Admitting: Family Medicine

## 2017-07-29 ENCOUNTER — Ambulatory Visit: Payer: BC Managed Care – PPO | Admitting: Family Medicine

## 2017-07-31 ENCOUNTER — Ambulatory Visit (INDEPENDENT_AMBULATORY_CARE_PROVIDER_SITE_OTHER): Payer: BC Managed Care – PPO | Admitting: Family Medicine

## 2017-07-31 ENCOUNTER — Encounter: Payer: Self-pay | Admitting: Family Medicine

## 2017-07-31 VITALS — BP 122/74 | HR 97 | Temp 98.1°F | Resp 18 | Ht 63.75 in | Wt 155.2 lb

## 2017-07-31 DIAGNOSIS — J340 Abscess, furuncle and carbuncle of nose: Secondary | ICD-10-CM | POA: Diagnosis not present

## 2017-07-31 DIAGNOSIS — Z23 Encounter for immunization: Secondary | ICD-10-CM

## 2017-07-31 MED ORDER — DOXYCYCLINE HYCLATE 100 MG PO TABS: 100.0000 mg | ORAL_TABLET | Freq: Two times a day (BID) | ORAL | 0 refills | Status: DC

## 2017-07-31 NOTE — Patient Instructions (Signed)
     IF you received an x-ray today, you will receive an invoice from [facility]. Please contact  Radiology at [phone] with questions or concerns regarding your invoice.   IF you received labwork today, you will receive an invoice from [facility]. Please contact [facility] at [phone] with questions or concerns regarding your invoice.   Our billing staff will not be able to assist you with questions regarding bills from these companies.  You will be contacted with the lab results as soon as they are available. The fastest way to get your results is to activate your My Chart account. Instructions are located on the last page of this paperwork. If you have not heard from us regarding the results in 2 weeks, please contact this office.     

## 2017-07-31 NOTE — Progress Notes (Signed)
11/1/20189:45 AM  [name] 1996/05/18, 21 y.o. female [id number]  Chief Complaint  Patient presents with  . Wound Check    on nose x3days and pt states popped it and now she has a hole     HPI:   Patient is a 21 y.o. female who presents today for redness, swelling on nose. She reports that about 3 days ago she noticed swelling under her eyes and a "pinmple" on the bridge of her nose. She popped it and pus came out, she did keep pressing it and it started to get larger, deeper, redder. She denies any fever or chills, nasal pain.   Depression screen [facility] 2/9 07/31/2017 05/23/2017 05/12/2017  Decreased Interest 0 0 0  Down, Depressed, Hopeless 0 0 0  PHQ - 2 Score 0 0 0  Altered sleeping - - -  Tired, decreased energy - - -  Change in appetite - - -  Feeling bad or failure about yourself  - - -  Trouble concentrating - - -  Moving slowly or fidgety/restless - - -  Suicidal thoughts - - -  PHQ-9 Score - - -  Difficult doing work/chores - - -    Allergies  Allergen Reactions  . Alprazolam Other (See Comments)    Makes er very angry and hostile  . Nickel Rash    Prior to Admission medications   Medication Sig Start Date End Date Taking? Authorizing Provider  amphetamine-dextroamphetamine (ADDERALL XR) 30 MG 24 hr capsule Take 30 mg by mouth 2 (two) times daily.    Yes [provider]  levothyroxine (SYNTHROID, LEVOTHROID) 150 MCG tablet Take 150 mcg by mouth daily before breakfast.  04/05/16  Yes [provider]  lithium carbonate (ESKALITH) 450 MG CR tablet Take 450 mg by mouth 2 (two) times daily.   Yes [provider]  lurasidone (LATUDA) 20 MG TABS tablet Take 20 mg by mouth.   Yes [provider]  Melatonin 3 MG TABS Take 3 mg by mouth at bedtime.   Yes [provider]  oxcarbazepine (TRILEPTAL) 600 MG tablet Take 600 mg by mouth 2 (two) times daily.  05/23/16  Yes [provider]  prazosin (MINIPRESS) 2 MG capsule  Take 2 mg by mouth at bedtime. Reported on 03/12/2016   Yes [provider]  sertraline (ZOLOFT) 100 MG tablet Take 100 mg by mouth at bedtime. Reported on 03/12/2016   Yes [provider]  albuterol (PROVENTIL HFA;VENTOLIN HFA) 108 (90 Base) MCG/ACT inhaler Inhale 2 puffs into the lungs every 6 (six) hours as needed for wheezing or shortness of breath. Patient not taking: Reported on 05/23/2017 05/12/17   Tenna Delaine D, PA-C  cloNIDine HCl (KAPVAY) 0.1 MG TB12 ER tablet Take 0.1 mg by mouth 2 (two) times daily.     [provider]  hydrOXYzine (VISTARIL) 25 MG capsule Take 25 mg by mouth daily with breakfast.     [provider]  ibuprofen (ADVIL,MOTRIN) 200 MG tablet Take 200 mg by mouth every 6 (six) hours as needed for fever, headache, mild pain, moderate pain or cramping.    [provider]  levonorgestrel-ethinyl estradiol (ORSYTHIA) 0.1-20 MG-MCG tablet Take 1 tablet by mouth daily. Patient not taking: Reported on 05/12/2017 08/22/14   Kerrie Buffalo, NP    Past Medical History:  Diagnosis Date  . ADD (attention deficit disorder)   . Allergy   . Anxiety   . Constitutional growth delay   . Depression   .  Goiter   . Hashimoto disease   . Headache(784.0)   . Obesity, unspecified 01/18/2013  . ODD (oppositional defiant disorder)   . PTSD (post-traumatic stress disorder)   . Reactive attachment disorder     Past Surgical History:  Procedure Laterality Date  . TONSILLECTOMY     age 24yo  . TYMPANOSTOMY TUBE PLACEMENT     BMTT in infancy    Social History  Substance Use Topics  . Smoking status: Current Every Day Smoker    Packs/day: 1.00    Years: 1.00    Types: Cigarettes  . Smokeless tobacco: Never Used  . Alcohol use No    Family History  Problem Relation Age of Onset  . Adopted: Yes  . Family history unknown: Yes    ROS Per hpi  OBJECTIVE:  Blood pressure 122/74, pulse 97, temperature 98.1 F (36.7 C),  temperature source Oral, resp. rate 18, height 5' 3.75" (1.619 m), weight 155 lb 3.2 oz (70.4 kg), last menstrual period 04/30/2017, SpO2 97 %.  Physical Exam Gen: AA0x3, NAD Resp:CTAB, no w/r/r CV: RRR no m/r/g Nares: erythematous, no drainage, no swelling, septum seems intact Skin: bridge of nose with swelling, erythema, warmth and a shallow ~ 1/2 inch oblong wound with small amount of fibrinous exudate overlying healthy granulation tissue.     ASSESSMENT and PLAN  1. Cellulitis of nose, external Basic nasal exam reassuring but I believe a more thorough evaluation is warranted, referring to ENT. Basic wound care instructions given. New abx r/se/b discussed.  - doxycycline (VIBRA-TABS) 100 MG tablet; Take 1 tablet (100 mg total) by mouth 2 (two) times daily. - Ambulatory referral to ENT  2. Need for influenza vaccination - Flu Vaccine QUAD 36+ mos IM   Return in about 2 days (around 08/02/2017).    Rutherford Guys, MD Primary Care at [place], [place] [address] Ph.  [phone] Fax [phone]

## 2017-08-02 ENCOUNTER — Ambulatory Visit: Payer: BC Managed Care – PPO | Admitting: Physician Assistant

## 2017-09-19 ENCOUNTER — Encounter (INDEPENDENT_AMBULATORY_CARE_PROVIDER_SITE_OTHER): Payer: Self-pay | Admitting: Family

## 2017-09-19 ENCOUNTER — Ambulatory Visit (INDEPENDENT_AMBULATORY_CARE_PROVIDER_SITE_OTHER): Payer: BC Managed Care – PPO | Admitting: Family

## 2017-09-19 DIAGNOSIS — G8929 Other chronic pain: Secondary | ICD-10-CM

## 2017-09-19 DIAGNOSIS — M25562 Pain in left knee: Secondary | ICD-10-CM | POA: Diagnosis not present

## 2017-09-19 NOTE — Progress Notes (Signed)
Office Visit Note   Patient: [name]           Date of Birth: 1996/07/19           MRN: [id number] Visit Date: 09/19/2017              Requested by: Maurice Small, MD [place], [place] [address] PCP: Maurice Small, MD  Chief Complaint  Patient presents with  . Left Knee - Pain      HPI: The patient 21-year-old woman who presents today complaining of left knee pain.  This is been chronic.  Was first seen for this in September of this year has been ongoing for several months prior to that and on through to today.  The problem has been waxing and waning she recently has had worsening over the last several weeks she complains of popping and worsening pain with extension.  Occasional giving way weakness in the knee she has fallen several times due to giving way.  Has been seen in urgent care for the same issue radiographs performed at urgent care were reviewed today these were negative for fracture.  There is no associated injury however her mother reports she was doing some jumping at the time of onset.  No twisting injury that they can recall.  Assessment & Plan: Visit Diagnoses:  1. Chronic pain of left knee     Plan: We will proceed with MRI of the left knee to evaluate for meniscal injury.  Recommended anti-inflammatories in the interim.  Follow-Up Instructions: Return for mri review.   Left Knee Exam   Tenderness  The patient is experiencing tenderness in the lateral joint line and medial joint line.  Range of Motion  Extension: normal Left knee extension: painful.  Flexion: normal   Tests  Varus: negative Valgus: negative  Other  Erythema: absent Swelling: none Effusion: no effusion present      Patient is alert, oriented, no adenopathy, well-dressed, normal affect, normal respiratory effort.   Imaging: No results found. No images are attached to the encounter.  Labs: Lab Results  Component Value Date   HGBA1C 5.8 (H)  10/22/2013   HGBA1C 5.6 01/08/2013   HGBA1C 5.4 07/07/2012   REPTSTATUS 06/29/2013 FINAL 06/28/2013   CULT  06/28/2013    INSIGNIFICANT GROWTH Performed at [facility]  06/15/2016    Three or more organisms present,each greater than 10,000 CFU/mL.These organisms,commonly found on external and internal genitalia,are considered to be colonizers.No further testing performed.     @LABSALLVALUES (HGBA1)@  There is no height or weight on file to calculate BMI.  Orders:  No orders of the defined types were placed in this encounter.  No orders of the defined types were placed in this encounter.    Procedures: No procedures performed  Clinical Data: No additional findings.  ROS:  All other systems negative, except as noted in the HPI. Review of Systems  Constitutional: Negative for chills and fever.  Musculoskeletal: Positive for arthralgias and myalgias. Negative for gait problem.    Objective: Vital Signs: There were no vitals taken for this visit.  Specialty Comments:  No specialty comments available.  PMFS History: Patient Active Problem List   Diagnosis Date Noted  . Bipolar I disorder, most recent episode depressed ([place])   . Bipolar 2 disorder, major depressive episode ([place]) 08/20/2014  . Attention deficit hyperactivity disorder (ADHD), combined type, severe 08/17/2014  . Salicylate overdose 61/44/3154  . Suicidal intent   .  Obesity, unspecified 01/18/2013  . MDD (major depressive disorder), recurrent episode, moderate ([place]) 07/06/2012  . ADHD (attention deficit hyperactivity disorder), predominantly hyperactive impulsive type 07/06/2012  . Oppositional defiant disorder 07/06/2012  . Reactive attachment disorder of infancy/early childhood, disinhibited 07/06/2012  . PTSD (post-traumatic stress disorder) 07/06/2012  . Goiter   . Hashimoto's thyroiditis   . Other specified acquired hypothyroidism 03/18/2011  . Short stature 03/18/2011    Past Medical History:  Diagnosis Date  . ADD (attention deficit disorder)   . Allergy   . Anxiety   . Constitutional growth delay   . Depression   . Goiter   . Hashimoto disease   . Headache(784.0)   . Obesity, unspecified 01/18/2013  . ODD (oppositional defiant disorder)   . PTSD (post-traumatic stress disorder)   . Reactive attachment disorder     Family History  Adopted: Yes  Family history unknown: Yes    Past Surgical History:  Procedure Laterality Date  . TONSILLECTOMY     age 70yo  . TYMPANOSTOMY TUBE PLACEMENT     BMTT in infancy   Social History   Occupational History  . Occupation: unemployed  Tobacco Use  . Smoking status: Current Every Day Smoker    Packs/day: 1.00    Years: 1.00    Pack years: 1.00    Types: Cigarettes  . Smokeless tobacco: Never Used  Substance and Sexual Activity  . Alcohol use: No    Alcohol/week: 0.0 oz  . Drug use: Yes    Types: Marijuana  . Sexual activity: No    Birth control/protection: Pill, Abstinence    Comment: In a homosexual relationship

## 2017-10-01 ENCOUNTER — Other Ambulatory Visit (INDEPENDENT_AMBULATORY_CARE_PROVIDER_SITE_OTHER): Payer: Self-pay | Admitting: Family

## 2017-10-01 ENCOUNTER — Telehealth (INDEPENDENT_AMBULATORY_CARE_PROVIDER_SITE_OTHER): Payer: Self-pay | Admitting: Family

## 2017-10-01 MED ORDER — DIAZEPAM 5 MG PO TABS: 5.0000 mg | ORAL_TABLET | Freq: Once | ORAL | 0 refills | Status: AC

## 2017-10-01 NOTE — Telephone Encounter (Signed)
Pt needs Valium to help her relax during MRI. Pt mother spoke with imaging center and they instructed pt mother to call us to get prescription.   MRI 10/03/17  Pharmacy  [facility]  Please call pt mother when prescription is ready for pick up

## 2017-10-01 NOTE — Telephone Encounter (Signed)
rx called and advised that rx has been called into pharm.

## 2017-10-03 ENCOUNTER — Telehealth (INDEPENDENT_AMBULATORY_CARE_PROVIDER_SITE_OTHER): Payer: Self-pay

## 2017-10-03 ENCOUNTER — Ambulatory Visit
Admission: RE | Admit: 2017-10-03 | Discharge: 2017-10-03 | Disposition: A | Discharge status: Home / Self Care | Payer: BC Managed Care – PPO | Source: Ambulatory Visit | Attending: Family | Admitting: Family

## 2017-10-03 DIAGNOSIS — G8929 Other chronic pain: Secondary | ICD-10-CM

## 2017-10-03 DIAGNOSIS — M25562 Pain in left knee: Principal | ICD-10-CM

## 2017-10-03 NOTE — Telephone Encounter (Signed)
Talked with patient's mother and R/S appt.with Dr. Marlou Sa for [place]. 10/09/17.

## 2017-10-03 NOTE — Telephone Encounter (Signed)
-----   Message from Maxcine Ham, RT sent at 10/03/2017  3:32 PM EST ----- Can we please reschedule to Dr. Randel Pigg schedule Thursday 10/09/2017 morning, instead of with Erin in the afternoon, since this is more knee pathology and Junie Panning would like to refer to Dr. Marlou Sa.

## 2017-10-09 ENCOUNTER — Ambulatory Visit (INDEPENDENT_AMBULATORY_CARE_PROVIDER_SITE_OTHER): Payer: BC Managed Care – PPO | Admitting: Family

## 2017-10-09 ENCOUNTER — Encounter (INDEPENDENT_AMBULATORY_CARE_PROVIDER_SITE_OTHER): Payer: Self-pay | Admitting: Orthopedic Surgery

## 2017-10-09 ENCOUNTER — Ambulatory Visit (INDEPENDENT_AMBULATORY_CARE_PROVIDER_SITE_OTHER): Payer: BC Managed Care – PPO | Admitting: Orthopedic Surgery

## 2017-10-09 DIAGNOSIS — S838X2D Sprain of other specified parts of left knee, subsequent encounter: Secondary | ICD-10-CM

## 2017-10-09 MED ORDER — MELOXICAM 15 MG PO TABS: ORAL_TABLET | ORAL | 0 refills | Status: DC

## 2017-10-10 ENCOUNTER — Encounter (INDEPENDENT_AMBULATORY_CARE_PROVIDER_SITE_OTHER): Payer: Self-pay | Admitting: Orthopedic Surgery

## 2017-10-10 DIAGNOSIS — S838X2D Sprain of other specified parts of left knee, subsequent encounter: Secondary | ICD-10-CM

## 2017-10-10 MED ORDER — METHYLPREDNISOLONE ACETATE 40 MG/ML IJ SUSP
40.0000 mg | INTRAMUSCULAR | Status: AC | PRN
  Administered 2017-10-10: 40 mg via INTRA_ARTICULAR

## 2017-10-10 MED ORDER — LIDOCAINE HCL 1 % IJ SOLN
5.0000 mL | INTRAMUSCULAR | Status: AC | PRN
  Administered 2017-10-10: 5 mL

## 2017-10-10 MED ORDER — BUPIVACAINE HCL 0.25 % IJ SOLN
4.0000 mL | INTRAMUSCULAR | Status: AC | PRN
  Administered 2017-10-10: 4 mL via INTRA_ARTICULAR

## 2017-10-10 NOTE — Progress Notes (Signed)
Office Visit Note   Patient: [name]           Date of Birth: [birth year]           MRN: [id number] Visit Date: 10/09/2017 Requested by: Maurice Small, MD [place], [place] [address] PCP: Maurice Small, MD  Subjective: Chief Complaint  Patient presents with  . Left Knee - Follow-up    HPI: Margaretha Sheffield is a patient with left knee pain who presents for review of MRI scan.  Thing for pain.  She states that she has chronic pain in the knee that comes and goes with the severity but has been worse since September 2018.  She reports popping and some pain with extension.  She is unable to run.  She describes having pain on both sides of the knee medially and laterally.  Denies any swelling.  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              ROS: All systems reviewed are negative as they relate to the chief complaint within the history of present illness.  Patient denies  fevers or chills.   Assessment & Plan: Visit Diagnoses:  1. Injury of meniscus of left knee, subsequent encounter     Plan: Impression is fairly underwhelming medial meniscal signal on MRI scan.  May or may not represent a tear.  She is having pain on both sides of the knee.  I would favor a course of Mobic 30 mg p.o. daily for 3 weeks plus injection today in 6-week return to decide for or against arthroscopic intervention.  I reviewed the MRI scan with her.  I do not think arthroscopy is completely unreasonable at this time but I would want to exhaust all conservative measures and nonoperative measures before going to arthroscopy particularly since she has pain on both sides of the knee.  Follow-Up Instructions: Return in about 6 weeks (around 11/20/2017).   Orders:  No orders of the defined types were placed in this encounter.  Meds ordered this encounter  Medications  . meloxicam (MOBIC) 15 MG tablet    Sig: 1 po q d x 3 weeks    Dispense:  30 tablet    Refill:  0       Procedures: Large Joint Inj: L knee on 10/10/2017 5:04 PM Indications: diagnostic evaluation, joint swelling and pain Details: 18 G 1.5 in needle, superolateral approach  Arthrogram: No  Medications: 5 mL lidocaine 1 %; 40 mg methylPREDNISolone acetate 40 MG/ML; 4 mL bupivacaine 0.25 % Outcome: tolerated well, no immediate complications Procedure, treatment alternatives, risks and benefits explained, specific risks discussed. Consent was given by the patient. Immediately prior to procedure a time out was called to verify the correct patient, procedure, equipment, support staff and site/side marked as required. Patient was prepped and draped in the usual sterile fashion.       Clinical Data: No additional findings.  Objective: Vital Signs: There were no vitals taken for this visit.  Physical Exam:   Constitutional: Patient appears well-developed HEENT:  Head: Normocephalic Eyes:EOM are normal Neck: Normal range of motion Cardiovascular: Normal rate Pulmonary/chest: Effort normal Neurologic: Patient is alert Skin: Skin is warm Psychiatric: Patient has normal mood and affect    Ortho Exam: Orthopedic exam demonstrates full active and passive range of motion of the left knee with no effusion.  There is medial and lateral joint line tenderness present.  Collateral  cruciate ligaments stable.  Range of motion full.  No patellar or quad tendon tenderness.  Negative McMurray compression testing.  No other masses lymph adenopathy or skin changes noted in the left knee region.  Specialty Comments:  No specialty comments available.  Imaging: No results found.   PMFS History: Patient Active Problem List   Diagnosis Date Noted  . Bipolar I disorder, most recent episode depressed ([place])   . Bipolar 2 disorder, major depressive episode ([place]) 08/20/2014  . Attention deficit hyperactivity disorder (ADHD), combined type, severe 08/17/2014  . Salicylate overdose 47/82/9562  .  Suicidal intent   . Obesity, unspecified 01/18/2013  . MDD (major depressive disorder), recurrent episode, moderate ([place]) 07/06/2012  . ADHD (attention deficit hyperactivity disorder), predominantly hyperactive impulsive type 07/06/2012  . Oppositional defiant disorder 07/06/2012  . Reactive attachment disorder of infancy/early childhood, disinhibited 07/06/2012  . PTSD (post-traumatic stress disorder) 07/06/2012  . Goiter   . Hashimoto's thyroiditis   . Other specified acquired hypothyroidism 03/18/2011  . Short stature 03/18/2011   Past Medical History:  Diagnosis Date  . ADD (attention deficit disorder)   . Allergy   . Anxiety   . Constitutional growth delay   . Depression   . Goiter   . Hashimoto disease   . Headache(784.0)   . Obesity, unspecified 01/18/2013  . ODD (oppositional defiant disorder)   . PTSD (post-traumatic stress disorder)   . Reactive attachment disorder     Family History  Adopted: Yes  Family history unknown: Yes    Past Surgical History:  Procedure Laterality Date  . TONSILLECTOMY     age 58yo  . TYMPANOSTOMY TUBE PLACEMENT     BMTT in infancy   Social History   Occupational History  . Occupation: unemployed  Tobacco Use  . Smoking status: Current Every Day Smoker    Packs/day: 1.00    Years: 1.00    Pack years: 1.00    Types: Cigarettes  . Smokeless tobacco: Never Used  Substance and Sexual Activity  . Alcohol use: No    Alcohol/week: 0.0 oz  . Drug use: Yes    Types: Marijuana  . Sexual activity: No    Birth control/protection: Pill, Abstinence    Comment: In a homosexual relationship

## 2017-10-27 ENCOUNTER — Ambulatory Visit: Payer: BC Managed Care – PPO | Admitting: Physician Assistant

## 2017-10-27 ENCOUNTER — Other Ambulatory Visit: Payer: Self-pay

## 2017-10-27 ENCOUNTER — Encounter: Payer: Self-pay | Admitting: Physician Assistant

## 2017-10-27 VITALS — BP 113/68 | HR 70 | Temp 99.5°F | Resp 18 | Ht 64.57 in | Wt 159.4 lb

## 2017-10-27 DIAGNOSIS — R05 Cough: Secondary | ICD-10-CM

## 2017-10-27 DIAGNOSIS — Z720 Tobacco use: Secondary | ICD-10-CM

## 2017-10-27 DIAGNOSIS — R059 Cough, unspecified: Secondary | ICD-10-CM

## 2017-10-27 DIAGNOSIS — J209 Acute bronchitis, unspecified: Secondary | ICD-10-CM | POA: Diagnosis not present

## 2017-10-27 DIAGNOSIS — R5383 Other fatigue: Secondary | ICD-10-CM

## 2017-10-27 DIAGNOSIS — R062 Wheezing: Secondary | ICD-10-CM | POA: Diagnosis not present

## 2017-10-27 DIAGNOSIS — R509 Fever, unspecified: Secondary | ICD-10-CM

## 2017-10-27 LAB — POCT CBC
HCT, POC: 46.2 % (ref 37.7–47.9)
Hemoglobin: 15.6 g/dL (ref 12.2–16.2)
Lymph, poc: 2.2 (ref 0.6–3.4)
MCH: 29.4 pg (ref 27–31.2)
MCHC: 33.9 g/dL (ref 31.8–35.4)
MCV: 86.8 fL (ref 80–97)
MID (CBC): 0.8 (ref 0–0.9)
MPV: 7.2 fL (ref 0–99.8)
POC Granulocyte: 11.7 — AB (ref 2–6.9)
POC LYMPH %: 15.2 % (ref 10–50)
POC MID %: 5.3 % (ref 0–12)
Platelet Count, POC: 321 10*3/uL (ref 142–424)
RBC: 5.32 M/uL (ref 4.04–5.48)
RDW, POC: 13.1 %
WBC: 14.7 10*3/uL — AB (ref 4.6–10.2)

## 2017-10-27 LAB — POCT INFLUENZA A/B
Influenza A, POC: NEGATIVE
Influenza B, POC: NEGATIVE

## 2017-10-27 MED ORDER — PREDNISONE 20 MG PO TABS: ORAL_TABLET | ORAL | 0 refills | Status: DC

## 2017-10-27 MED ORDER — AZITHROMYCIN 250 MG PO TABS: ORAL_TABLET | ORAL | 0 refills | Status: DC

## 2017-10-27 MED ORDER — METHYLPREDNISOLONE SODIUM SUCC 125 MG IJ SOLR: 60.0000 mg | Freq: Once | INTRAMUSCULAR | Status: DC

## 2017-10-27 MED ORDER — IPRATROPIUM BROMIDE 0.02 % IN SOLN
0.5000 mg | Freq: Once | RESPIRATORY_TRACT | Status: AC
  Administered 2017-10-27: 0.5 mg via RESPIRATORY_TRACT

## 2017-10-27 MED ORDER — ALBUTEROL SULFATE HFA 108 (90 BASE) MCG/ACT IN AERS: 2.0000 | INHALATION_SPRAY | RESPIRATORY_TRACT | 0 refills | Status: DC | PRN

## 2017-10-27 MED ORDER — METHYLPREDNISOLONE SODIUM SUCC 125 MG IJ SOLR
60.0000 mg | Freq: Once | INTRAMUSCULAR | Status: AC
  Administered 2017-10-27: 60 mg via INTRAMUSCULAR

## 2017-10-27 MED ORDER — ALBUTEROL SULFATE (2.5 MG/3ML) 0.083% IN NEBU
2.5000 mg | INHALATION_SOLUTION | Freq: Once | RESPIRATORY_TRACT | Status: AC
  Administered 2017-10-27: 2.5 mg via RESPIRATORY_TRACT

## 2017-10-27 MED ORDER — BENZONATATE 100 MG PO CAPS
100.0000 mg | ORAL_CAPSULE | Freq: Three times a day (TID) | ORAL | 0 refills | Status: DC | PRN
Start: 2017-10-27 — End: 2018-01-09

## 2017-10-27 NOTE — Progress Notes (Signed)
MRN: [id number] DOB: 1996-05-23  Subjective:   [name] is a 22 y.o. female presenting for chief complaint of Sore Throat (X 2 days); Cough (X 2 days); and Chills (X 2 day - with fatigue) .  Reports 2 day history of worsening sore throat, mix of dry and productive cough (no hemoptysis), wheezing, shortness of breath, generalized body aches, subjective fever and chills. Notes she had a cough prior to this for a few days but none of the other symptoms. Has tried vitamin c and advil with no full relief. Denies sinus pain, ear pain, chest pain, nausea, vomiting, abdominal pain and diarrhea. Has not had sick contact with anyone. No history of seasonal allergies, no history of asthma. Has hx of bronchitis with bronchospasm. Patient has had flu shot this season. Current smoker, smokes 1ppd x 1 year. Denies any other aggravating or relieving factors, no other questions or concerns.   [name] has a current medication list which includes the following prescription(s): amphetamine-dextroamphetamine, levothyroxine, lithium carbonate, lurasidone, melatonin, meloxicam, prazosin, sertraline, albuterol, azithromycin, benzonatate, clonidine hcl, diazepam, doxycycline, hydroxyzine, ibuprofen, levonorgestrel-ethinyl estradiol, oxcarbazepine, and prednisone, and the following Facility-Administered Medications: methylprednisolone sodium succinate. Also is allergic to alprazolam and nickel.  [name]  has a past medical history of ADD (attention deficit disorder), Allergy, Anxiety, Constitutional growth delay, Depression, Goiter, Hashimoto disease, Headache(784.0), Obesity, unspecified (01/18/2013), ODD (oppositional defiant disorder), PTSD (post-traumatic stress disorder), and Reactive attachment disorder. Also  has a past surgical history that includes Tonsillectomy and Tympanostomy tube placement.   Objective:   Vitals: BP 113/68 (BP Location: Left Arm, Patient Position: Sitting, Cuff Size: Normal)   Pulse 70    Temp 99.5 F (37.5 C) (Oral)   Resp 18   Ht 5' 4.57" (1.64 m)   Wt 159 lb 6.4 oz (72.3 kg)   LMP 10/25/2017 (Exact Date)   SpO2 96%   BMI 26.88 kg/m   Physical Exam  Constitutional: She is oriented to person, place, and time. She appears well-developed and well-nourished. No distress.  HENT:  Head: Normocephalic and atraumatic.  Right Ear: Tympanic membrane, external ear and ear canal normal.  Left Ear: Tympanic membrane, external ear and ear canal normal.  Nose: Nose normal.  Mouth/Throat: Uvula is midline and mucous membranes are normal. Posterior oropharyngeal erythema present. Tonsils are 0 on the right. Tonsils are 0 on the left. No tonsillar exudate.  Eyes: Conjunctivae are normal.  Neck: Normal range of motion.  Pulmonary/Chest: Effort normal. She has wheezes (diffuse in posterior and anterior lung fields bilaterally). She has no rhonchi. She has no rales.  Lymphadenopathy:       Head (right side): No submental, no submandibular, no tonsillar, no preauricular, no posterior auricular and no occipital adenopathy present.       Head (left side): No submental, no submandibular, no tonsillar, no preauricular, no posterior auricular and no occipital adenopathy present.    She has cervical adenopathy.       Right cervical: Posterior cervical adenopathy present.       Left cervical: Posterior cervical adenopathy present.       Right: No supraclavicular adenopathy present.       Left: No supraclavicular adenopathy present.  Neurological: She is alert and oriented to person, place, and time.  Skin: Skin is warm and dry.  Psychiatric: She has a normal mood and affect.  Vitals reviewed.   Results for orders placed or performed in visit on 10/27/17 (from the past 24 hour(s))  POCT Influenza  A/B     Status: None   Collection Time: 10/27/17 10:07 AM  Result Value Ref Range   Influenza A, POC Negative Negative   Influenza B, POC Negative Negative  POCT CBC     Status: Abnormal    Collection Time: 10/27/17 10:25 AM  Result Value Ref Range   WBC 14.7 (A) 4.6 - 10.2 K/uL   Lymph, poc 2.2 0.6 - 3.4   POC LYMPH PERCENT 15.2 10 - 50 %L   MID (cbc) 0.8 0 - 0.9   POC MID % 5.3 0 - 12 %M   POC Granulocyte 11.7 (A) 2 - 6.9   Granulocyte percent  37 - 80 %G   RBC 5.32 4.04 - 5.48 M/uL   Hemoglobin 15.6 12.2 - 16.2 g/dL   HCT, POC 46.2 37.7 - 47.9 %   MCV 86.8 80 - 97 fL   MCH, POC 29.4 27 - 31.2 pg   MCHC 33.9 31.8 - 35.4 g/dL   RDW, POC 13.1 %   Platelet Count, POC 321 142 - 424 K/uL   MPV 7.2 0 - 99.8 fL    Post duoneb, wheezing has improved but is still noted in posterior and anterior lung fields bilaterally. SpO2 improved from 96% to 97% Assessment and Plan :  1. Fever, unspecified fever cause - POCT Influenza A/B 2. Wheezing Improved in office but not completely resolved.  - ipratropium (ATROVENT) nebulizer solution 0.5 mg - albuterol (PROVENTIL) (2.5 MG/3ML) 0.083% nebulizer solution 2.5 mg - POCT CBC - predniSONE (DELTASONE) 20 MG tablet; Take 3 PO QAM x3days, 2 PO QAM x3days, 1 PO QAM x3days  Dispense: 18 tablet; Refill: 0 - albuterol (PROVENTIL HFA;VENTOLIN HFA) 108 (90 Base) MCG/ACT inhaler; Inhale 2 puffs into the lungs every 4 (four) hours as needed for wheezing or shortness of breath (cough, shortness of breath or wheezing.).  Dispense: 1 Inhaler; Refill: 0 - methylPREDNISolone sodium succinate (SOLU-MEDROL) 125 mg/2 mL injection 60 mg 3. Cough - benzonatate (TESSALON) 100 MG capsule; Take 1-2 capsules (100-200 mg total) by mouth 3 (three) times daily as needed for cough.  Dispense: 40 capsule; Refill: 0 4. Bronchitis with bronchospasm 5. Tobacco Abuse Due to hx and PE findings will treat for underlying bacterial etiology and for symptomatic relief. Pt's wheezing has improved, but is still present. She appears stable and in no acute distress. Vitals are stable. Pt notes she has never been dx with asthma but has frequently had bronchospasms with  illnesses. Would likely benefit from in office PFTs. Recommend following up after she completes medication for reevaluation. Plan to repeat CBC at this visit and to perform PFTs. Discussed smoking cessation with patient. She is not ready to quit at this time. Educated on benefits of smoking cessation. Plan to return sooner if any symptoms worsen or she develops new concerning sx.  - azithromycin (ZITHROMAX) 250 MG tablet; Take 2 tabs PO x 1 dose, then 1 tab PO QD x 4 days  Dispense: 6 tablet; Refill: 0   Tenna Delaine, PA-C  [facility] [place] 10/27/2017 11:06 AM

## 2017-10-27 NOTE — Patient Instructions (Addendum)
We are going to treat this potential underlying bacterial etiology with a z pack, please take as prescribed. Start antibiotic today.   We are going to treat your underlying inflammation with oral prednisone. Start the oral prednisone tomorrow.  Prednisone is a steroid and can cause side effects such as headache, irritability, nausea, vomiting, increased heart rate, increased blood pressure, increased blood sugar, appetite changes, and insomnia. Please take tablets in the morning with a full meal to help decrease the chances of these side effects.  You may use albuterol inhaler every 4-6 hours as needed for wheezing and tessalon perles for cough.  I recommend following up in after you complete medication for reevaluation. Return sooner if symptoms worsen. Thank you for letting me participate in your health and well being.   Bronchospasm, Adult Bronchospasm is a tightening of the airways going into the lungs. During an episode, it may be harder to breathe. You may cough, and you may make a whistling sound when you breathe (wheeze). This condition often affects people with asthma. What are the causes? This condition is caused by swelling and irritation in the airways. It can be triggered by:  An infection (common).  Seasonal allergies.  An allergic reaction.  Exercise.  Irritants. These include pollution, cigarette smoke, strong odors, aerosol sprays, and paint fumes.  Weather changes. Winds increase molds and pollens in the air. Cold air may cause swelling.  Stress and emotional upset.  What are the signs or symptoms? Symptoms of this condition include:  Wheezing. If the episode was triggered by an allergy, wheezing may start right away or hours later.  Nighttime coughing.  Frequent or severe coughing with a simple cold.  Chest tightness.  Shortness of breath.  Decreased ability to exercise.  How is this diagnosed? This condition is usually diagnosed with a review of  your medical history and a physical exam. Tests, such as lung function tests, are sometimes done to look for other conditions. The need for a chest X-ray depends on where the wheezing occurs and whether it is the first time you have wheezed. How is this treated? This condition may be treated with:  Inhaled medicines. These open up the airways and help you breathe. They can be taken with an inhaler or a nebulizer device.  Corticosteroid medicines. These may be given for severe bronchospasm, usually when it is associated with asthma.  Avoiding triggers, such as irritants, infection, or allergies.  Follow these instructions at home: Medicines  Take over-the-counter and prescription medicines only as told by your health care provider.  If you need to use an inhaler or nebulizer to take your medicine, ask your health care provider to explain how to use it correctly. If you were given a spacer, always use it with your inhaler. Lifestyle  Reduce the number of triggers in your home. To do this: ? Change your heating and air conditioning filter at least once a month. ? Limit your use of fireplaces and wood stoves. ? Do not smoke. Do not allow smoking in your home. ? Avoid using perfumes and fragrances. ? Get rid of pests, such as roaches and mice, and their droppings. ? Remove any mold from your home. ? Keep your house clean and dust free. Use unscented cleaning products. ? Replace carpet with wood, tile, or vinyl flooring. Carpet can trap dander and dust. ? Use allergy-proof pillows, mattress covers, and box spring covers. ? Wash bed sheets and blankets every week in hot water. Dry them in  a dryer. ? Use blankets that are made of polyester or cotton. ? Wash your hands often. ? Do not allow pets in your bedroom.  Avoid breathing in cold air when you exercise. General instructions  Have a plan for seeking medical care. Know when to call your health care provider and local emergency  services, and where to get emergency care.  Stay up to date on your immunizations.  When you have an episode of bronchospasm, stay calm. Try to relax and breathe more slowly.  If you have asthma, make sure you have an asthma action plan.  Keep all follow-up visits as told by your health care provider. This is important. Contact a health care provider if:  You have muscle aches.  You have chest pain.  The mucus that you cough up (sputum) changes from clear or white to yellow, green, gray, or bloody.  You have a fever.  Your sputum gets thicker. Get help right away if:  Your wheezing and coughing get worse, even after you take your prescribed medicines.  It gets even harder to breathe.  You develop severe chest pain. Summary  Bronchospasm is a tightening of the airways going into the lungs.  During an episode of bronchospasm, you may have a harder time breathing. You may cough and make a whistling sound when you breathe (wheeze).  Avoid exposure to triggers such as smoke, dust, mold, animal dander, and fragrances.  When you have an episode of bronchospasm, stay calm. Try to relax and breathe more slowly. This information is not intended to replace advice given to you by your health care provider. Make sure you discuss any questions you have with your health care provider. Document Released: 09/19/2003 Document Revised: 09/12/2016 Document Reviewed: 09/12/2016 Elsevier Interactive Patient Education  2017 [employer]   Acute Bronchitis, Adult Acute bronchitis is when air tubes (bronchi) in the lungs suddenly get swollen. The condition can make it hard to breathe. It can also cause these symptoms:  A cough.  Coughing up clear, yellow, or green mucus.  Wheezing.  Chest congestion.  Shortness of breath.  A fever.  Body aches.  Chills.  A sore throat.  Follow these instructions at home: Medicines  Take over-the-counter and prescription medicines only as  told by your doctor.  If you were prescribed an antibiotic medicine, take it as told by your doctor. Do not stop taking the antibiotic even if you start to feel better. General instructions  Rest.  Drink enough fluids to keep your pee (urine) clear or pale yellow.  Avoid smoking and secondhand smoke. If you smoke and you need help quitting, ask your doctor. Quitting will help your lungs heal faster.  Use an inhaler, cool mist vaporizer, or humidifier as told by your doctor.  Keep all follow-up visits as told by your doctor. This is important. How is this prevented? To lower your risk of getting this condition again:  Wash your hands often with soap and water. If you cannot use soap and water, use hand sanitizer.  Avoid contact with people who have cold symptoms.  Try not to touch your hands to your mouth, nose, or eyes.  Make sure to get the flu shot every year.  Contact a doctor if:  Your symptoms do not get better in 2 weeks. Get help right away if:  You cough up blood.  You have chest pain.  You have very bad shortness of breath.  You become dehydrated.  You faint (pass out) or  keep feeling like you are going to pass out.  You keep throwing up (vomiting).  You have a very bad headache.  Your fever or chills gets worse. This information is not intended to replace advice given to you by your health care provider. Make sure you discuss any questions you have with your health care provider. Document Released: 03/04/2008 Document Revised: 04/24/2016 Document Reviewed: 03/06/2016 Elsevier Interactive Patient Education  2018 [employer].  IF you received an x-ray today, you will receive an invoice from [facility]. Please contact [facility] at [phone] with questions or concerns regarding your invoice.   IF you received labwork today, you will receive an invoice from [facility]. Please contact [facility] at [phone] with questions or concerns  regarding your invoice.   Our billing staff will not be able to assist you with questions regarding bills from these companies.  You will be contacted with the lab results as soon as they are available. The fastest way to get your results is to activate your My Chart account. Instructions are located on the last page of this paperwork. If you have not heard from [place] regarding the results in 2 weeks, please contact this office.

## 2017-11-08 ENCOUNTER — Ambulatory Visit: Payer: BC Managed Care – PPO | Admitting: Physician Assistant

## 2017-11-11 ENCOUNTER — Other Ambulatory Visit: Payer: Self-pay | Admitting: Family Medicine

## 2017-11-11 DIAGNOSIS — D1800 Hemangioma unspecified site: Secondary | ICD-10-CM

## 2017-11-12 ENCOUNTER — Other Ambulatory Visit: Payer: Self-pay | Admitting: Physician Assistant

## 2017-11-12 DIAGNOSIS — R062 Wheezing: Secondary | ICD-10-CM

## 2017-11-24 ENCOUNTER — Encounter (INDEPENDENT_AMBULATORY_CARE_PROVIDER_SITE_OTHER): Payer: Self-pay | Admitting: Orthopedic Surgery

## 2017-11-24 ENCOUNTER — Ambulatory Visit (INDEPENDENT_AMBULATORY_CARE_PROVIDER_SITE_OTHER): Payer: BC Managed Care – PPO | Admitting: Orthopedic Surgery

## 2017-11-24 DIAGNOSIS — M25562 Pain in left knee: Secondary | ICD-10-CM | POA: Diagnosis not present

## 2017-11-24 DIAGNOSIS — G8929 Other chronic pain: Secondary | ICD-10-CM | POA: Diagnosis not present

## 2017-11-25 ENCOUNTER — Ambulatory Visit
Admission: RE | Admit: 2017-11-25 | Discharge: 2017-11-25 | Disposition: A | Discharge status: Home / Self Care | Payer: BC Managed Care – PPO | Source: Ambulatory Visit | Attending: Family Medicine | Admitting: Family Medicine

## 2017-11-25 ENCOUNTER — Encounter (INDEPENDENT_AMBULATORY_CARE_PROVIDER_SITE_OTHER): Payer: Self-pay | Admitting: Orthopedic Surgery

## 2017-11-25 DIAGNOSIS — D1800 Hemangioma unspecified site: Secondary | ICD-10-CM

## 2017-11-25 NOTE — Progress Notes (Signed)
Office Visit Note   Patient: [name]           Date of Birth: [birth year]           MRN: [id number] Visit Date: 11/24/2017 Requested by: Maurice Small, MD [place], [place] [address] PCP: Maurice Small, MD  Subjective: Chief Complaint  Patient presents with  . Left Knee - Follow-up    HPI: [name] is a patient with left knee pain and arthritis.  She had an injection 10/09/2017 which gave her good relief for about 5 weeks.  She now reports pain levels recurring to the level they were prior to the injection.  She states that she is having a lot of anterior pain.  She cannot run.  She is walking okay but then he gets hard after prolonged walking.  It is hard for her to get in and out of the car.  She has had an MRI scan which is pretty unremarkable.  Most of her pain is anterior today.              ROS: All systems reviewed are negative as they relate to the chief complaint within the history of present illness.  Patient denies  fevers or chills.   Assessment & Plan: Visit Diagnoses: No diagnosis found.  Plan: Impression is left knee pain.  I think her decision point at this time is for or against arthroscopic surgery or just living with what she has.  This is been going on now for 4 years.  I do not think that arthroscopy would be an unreasonable thing to do at this time focusing on debridement of some of the fat pad behind the kneecap and patellar tendon.  She did get good relief from the injection indicating pain generators inside the joint.  Nonetheless is not a slam dunk guarantee that she would get better with that intervention.  There is some debate between her and her mother about proper course of treatment.  We discussed another injection but the patient wants to hold off on that intervention.  When they decide for or against surgery or for or against injection when she should come back.  Otherwise I will see her back as needed.  Follow-Up  Instructions: Return if symptoms worsen or fail to improve.   Orders:  No orders of the defined types were placed in this encounter.  No orders of the defined types were placed in this encounter.     Procedures: No procedures performed   Clinical Data: No additional findings.  Objective: Vital Signs: LMP 10/25/2017 (Exact Date)   Physical Exam:   Constitutional: Patient appears well-developed HEENT:  Head: Normocephalic Eyes:EOM are normal Neck: Normal range of motion Cardiovascular: Normal rate Pulmonary/chest: Effort normal Neurologic: Patient is alert Skin: Skin is warm Psychiatric: Patient has normal mood and affect    Ortho Exam: Orthopedic exam demonstrates no effusion in the left knee.  She does have more crepitus in the retropatellar region on the left compared to the right.  No masses lymphadenopathy or skin changes noted in that left knee region.  Collateral and cruciate ligaments are stable.  Extensor mechanism is intact.  Extensor mechanism is nontender but she does have some fat pad tenderness to palpation and some popping and clicking in the left knee that is not present on the right  Specialty Comments:  No specialty comments available.  Imaging: US Abdomen Limited Ruq  Result Date: 11/25/2017 CLINICAL DATA:  Questionable  lesion right lobe of liver seen on prior CT EXAM: ULTRASOUND ABDOMEN LIMITED RIGHT UPPER QUADRANT COMPARISON:  CT abdomen pelvis June 06, 2015 FINDINGS: Gallbladder: No gallstones or wall thickening visualized. There is no pericholecystic fluid. No sonographic Murphy sign noted by sonographer. Common bile duct: Diameter: 3 mm. No intrahepatic or extrahepatic biliary duct dilatation. Liver: No focal lesion identified. Within normal limits in parenchymal echogenicity. Portal vein is patent on color Doppler imaging with normal direction of blood flow towards the liver. IMPRESSION: No focal liver lesion is evident by ultrasound. In  particular, there is no lesion in the inferior liver on the right as question on prior CT examination from 2016. Study otherwise unremarkable. Electronically Signed   By: Lowella Grip III M.D.   On: 11/25/2017 09:14     PMFS History: Patient Active Problem List   Diagnosis Date Noted  . Bipolar I disorder, most recent episode depressed ([place])   . Bipolar 2 disorder, major depressive episode ([place]) 08/20/2014  . Attention deficit hyperactivity disorder (ADHD), combined type, severe 08/17/2014  . Salicylate overdose 50/05/3817  . Suicidal intent   . Obesity, unspecified 01/18/2013  . MDD (major depressive disorder), recurrent episode, moderate ([place]) 07/06/2012  . ADHD (attention deficit hyperactivity disorder), predominantly hyperactive impulsive type 07/06/2012  . Oppositional defiant disorder 07/06/2012  . Reactive attachment disorder of infancy/early childhood, disinhibited 07/06/2012  . PTSD (post-traumatic stress disorder) 07/06/2012  . Goiter   . Hashimoto's thyroiditis   . Other specified acquired hypothyroidism 03/18/2011  . Short stature 03/18/2011   Past Medical History:  Diagnosis Date  . ADD (attention deficit disorder)   . Allergy   . Anxiety   . Constitutional growth delay   . Depression   . Goiter   . Hashimoto disease   . Headache(784.0)   . Obesity, unspecified 01/18/2013  . ODD (oppositional defiant disorder)   . PTSD (post-traumatic stress disorder)   . Reactive attachment disorder     Family History  Adopted: Yes  Family history unknown: Yes    Past Surgical History:  Procedure Laterality Date  . TONSILLECTOMY     age 56yo  . TYMPANOSTOMY TUBE PLACEMENT     BMTT in infancy   Social History   Occupational History  . Occupation: unemployed  Tobacco Use  . Smoking status: Current Every Day Smoker    Packs/day: 1.00    Years: 1.00    Pack years: 1.00    Types: Cigarettes  . Smokeless tobacco: Never Used  Substance and Sexual Activity  .  Alcohol use: No    Alcohol/week: 0.0 oz  . Drug use: Yes    Types: Marijuana  . Sexual activity: Not Currently    Birth control/protection: Pill, Abstinence    Comment: In a homosexual relationship

## 2018-01-09 ENCOUNTER — Ambulatory Visit: Payer: BC Managed Care – PPO | Admitting: Physician Assistant

## 2018-01-09 ENCOUNTER — Encounter: Payer: Self-pay | Admitting: Physician Assistant

## 2018-01-09 ENCOUNTER — Other Ambulatory Visit: Payer: Self-pay

## 2018-01-09 VITALS — BP 117/76 | HR 61 | Temp 98.4°F | Resp 18 | Ht 64.17 in | Wt 166.8 lb

## 2018-01-09 DIAGNOSIS — Z30019 Encounter for initial prescription of contraceptives, unspecified: Secondary | ICD-10-CM

## 2018-01-09 LAB — POCT URINE PREGNANCY: Preg Test, Ur: NEGATIVE

## 2018-01-09 MED ORDER — DIAZEPAM 5 MG PO TABS
ORAL_TABLET | ORAL | 0 refills | Status: DC
Start: 2018-01-09 — End: 2023-09-01

## 2018-01-09 NOTE — Patient Instructions (Addendum)
Once you let me know the name of the provider you saw before, I will refer you back there for the Paragard IUD.    IF you received an x-ray today, you will receive an invoice from [facility]. Please contact [facility] at [phone] with questions or concerns regarding your invoice.   IF you received labwork today, you will receive an invoice from [facility]. Please contact [facility] at [phone] with questions or concerns regarding your invoice.   Our billing staff will not be able to assist you with questions regarding bills from these companies.  You will be contacted with the lab results as soon as they are available. The fastest way to get your results is to activate your My Chart account. Instructions are located on the last page of this paperwork. If you have not heard from [place] regarding the results in 2 weeks, please contact this office.

## 2018-01-09 NOTE — Progress Notes (Signed)
Patient ID: [name], female    DOB: 05/02/1996, 22 y.o.   MRN: [id number]  PCP: Maurice Small, MD  Chief Complaint  Patient presents with  . Contraception    discuss birth control    Subjective:   Presents for evaluation of contraception options. She is accompanied by her mother.  [name] reports that she is here to discuss contraception only because her parents are requiring that she use contraception if she wants to continue to have access to their vehicle.  She is sexually active and does not desire pregnancy.  She has previously used COC (caused unacceptable weight gain) and Nexplanon (associated with severe local reaction requiring removal after only 1 week).  Not interested in any progesterone-containing product. Mother is concerned that condoms are inadequately effective due to the risk of breakage.    Review of Systems     Patient Active Problem List   Diagnosis Date Noted  . Bipolar I disorder, most recent episode depressed ([place])   . Bipolar 2 disorder, major depressive episode ([place]) 08/20/2014  . Attention deficit hyperactivity disorder (ADHD), combined type, severe 08/17/2014  . Salicylate overdose 27/11/5007  . Suicidal intent   . Obesity, unspecified 01/18/2013  . MDD (major depressive disorder), recurrent episode, moderate ([place]) 07/06/2012  . ADHD (attention deficit hyperactivity disorder), predominantly hyperactive impulsive type 07/06/2012  . Oppositional defiant disorder 07/06/2012  . Reactive attachment disorder of infancy/early childhood, disinhibited 07/06/2012  . PTSD (post-traumatic stress disorder) 07/06/2012  . Goiter   . Hashimoto's thyroiditis   . Other specified acquired hypothyroidism 03/18/2011  . Short stature 03/18/2011     Prior to Admission medications   Medication Sig Start Date End Date Taking? Authorizing Provider  amphetamine-dextroamphetamine (ADDERALL XR) 20 MG 24 hr capsule Take 40 mg by mouth daily. 12/14/17  Yes  [provider]  LATUDA 120 MG TABS Take 120 mg by mouth every evening. 12/26/17  Yes [provider]  levothyroxine (SYNTHROID, LEVOTHROID) 150 MCG tablet Take 150 mcg by mouth daily before breakfast.  04/05/16  Yes [provider]  lithium carbonate (ESKALITH) 450 MG CR tablet Take 450 mg by mouth 2 (two) times daily.   Yes [provider]  Melatonin 3 MG TABS Take 3 mg by mouth at bedtime.   Yes [provider]  prazosin (MINIPRESS) 2 MG capsule Take 2 mg by mouth at bedtime. Reported on 03/12/2016   Yes [provider]  sertraline (ZOLOFT) 100 MG tablet Take 100 mg by mouth at bedtime. Reported on 03/12/2016   Yes [provider]     Allergies  Allergen Reactions  . Alprazolam Other (See Comments)    Makes er very angry and hostile  . Nickel Rash       Objective:  Physical Exam  Constitutional: She is oriented to person, place, and time. She appears well-developed and well-nourished. She is active and cooperative. No distress.  BP 117/76 (BP Location: Left Arm, Patient Position: Sitting, Cuff Size: Normal)   Pulse 61   Temp 98.4 F (36.9 C) (Oral)   Resp 18   Ht 5' 4.17" (1.63 m)   Wt 166 lb 12.8 oz (75.7 kg)   LMP 12/29/2017 (Approximate)   SpO2 100%   BMI 28.48 kg/m    Eyes: Conjunctivae are normal.  Pulmonary/Chest: Effort normal.  Neurological: She is alert and oriented to person, place, and time.  Psychiatric: Her speech is normal and behavior is normal. Her mood appears not anxious. Her affect  is angry and blunt. Her affect is not labile and not inappropriate. She is not actively hallucinating. Thought content is not paranoid and not delusional. Cognition and memory are not impaired. She does not exhibit a depressed mood. She expresses no homicidal and no suicidal ideation. She exhibits normal recent memory and normal remote memory. She is attentive.    Wt Readings from Last 3 Encounters:  01/09/18 166 lb 12.8  oz (75.7 kg)  10/27/17 159 lb 6.4 oz (72.3 kg)  07/31/17 155 lb 3.2 oz (70.4 kg)   Results for orders placed or performed in visit on 01/09/18  POCT urine pregnancy  Result Value Ref Range   Preg Test, Ur Negative Negative       Assessment & Plan:   1. Encounter for female birth control Thorough review of contraceptive options. Most are unacceptable to either the patient or her mother. Copper IUD is acceptable to both. Patient liked the provider who placed the Nexplanon, and they will contact me with that person's name so that I can refer her back there. I have prescribed diazepam for her to take 30 minutes prior to the insertion. - POCT urine pregnancy    Return if symptoms worsen or fail to improve.   Fara Chute, PA-C [facility]

## 2018-01-20 ENCOUNTER — Telehealth: Payer: Self-pay | Admitting: Physician Assistant

## 2018-01-20 DIAGNOSIS — Z30019 Encounter for initial prescription of contraceptives, unspecified: Secondary | ICD-10-CM

## 2018-01-20 NOTE — Telephone Encounter (Signed)
Copied from [place] [id number]. Topic: Referral - Status >> Jan 20, 2018  7:37 AM Arletha Grippe wrote: Reason for CRM: mother called - she is asking about on referral status to [facility] for paraguard iud for pt.  Please call [phone]  -----------------------------------------------------------------------------------------  Please advise. It looks like per OV notes we were waiting to see which doctor pt wanted to be referred to before placing referral. Thanks!

## 2018-01-20 NOTE — Telephone Encounter (Signed)
Yes, I am waiting for the name of the provider they wanted to go back to. I will put in the referral and add [facility] to the comments.  Orders Placed This Encounter  Procedures  . Ambulatory referral to Obstetrics / Gynecology    Referral Priority:   Routine    Referral Type:   Consultation    Referral Reason:   Specialty Services Required    Requested Specialty:   Obstetrics and Gynecology    Number of Visits Requested:   1

## 2018-08-07 ENCOUNTER — Ambulatory Visit (INDEPENDENT_AMBULATORY_CARE_PROVIDER_SITE_OTHER): Payer: BC Managed Care – PPO | Admitting: Orthopedic Surgery

## 2018-08-31 ENCOUNTER — Ambulatory Visit: Payer: BC Managed Care – PPO | Admitting: Family Medicine

## 2018-09-02 ENCOUNTER — Ambulatory Visit: Payer: Self-pay | Admitting: *Deleted

## 2018-09-02 NOTE — Telephone Encounter (Signed)
Pt reports "Fell off bottom of slide" this afternoon at 1300. States went down sliding board, "Picked up a lot of speed" and fell at end of slide, hitting head and neck. States  "Friend heard my head hit, then neck hit the hardest."   Denies LOC but states "Stunned, I remember lying there and holding my neck." Pt reports nausea presently, pain 6.5/10. States pain is "Where neck meets spine."  Reports pain worse when lifting arms, coughing.  No lacerations.Pt tearful during call.  Pt directed to ED. States will follow disposition. Care advise given per protocol.  Reason for Disposition . Dangerous injury (e.g., MVA, diving, trampoline, contact sports, fall > 10 feet or 3 meters) or severe blow from hard object (e.g., golf club or baseball bat)  Answer Assessment - Initial Assessment Questions 1. MECHANISM: "How did the injury happen?" For falls, ask: "What height did you fall from?" and "What surface did you fall against?"      At bottom of slide fell off, hit slide head first, then neck 2. ONSET: "When did the injury happen?" (Minutes or hours ago)      1300 today 3. NEUROLOGIC SYMPTOMS: "Was there any loss of consciousness?" "Are there any other neurological symptoms?"     "Stunned"  "I remember lying there holding my neck." 4. MENTAL STATUS: "Does the person know who he is, who you are, and where he is?"      yes 5. LOCATION: "What part of the head was hit?"      Head first, then upper back and neck "Neck took the blunt of it." 6. SCALP APPEARANCE: "What does the scalp look like? Is it bleeding now?" If so, ask: "Is it difficult to stop?"      no 7. SIZE: For cuts, bruises, or swelling, ask: "How large is it?" (e.g., inches or centimeters)      Maybe bruising, no lacerations 8. PAIN: "Is there any pain?" If so, ask: "How bad is it?"  (e.g., Scale 1-10; or mild, moderate, severe)     6.5/10   Neck is tender to touch, painful raising arms, coughing 9. TETANUS: For any breaks in the skin, ask:  "When was the last tetanus booster?"     NA 10. OTHER SYMPTOMS: "Do you have any other symptoms?" (e.g., neck pain, vomiting)       Neck pain, nauseated 11. PREGNANCY: "Is there any chance you are pregnant?" "When was your last menstrual period?"       no  Protocols used: HEAD INJURY-A-AH

## 2019-07-19 IMAGING — MR MR KNEE*L* W/O CM
4 of 6 series · 20 of 40 positions shown · non-contrast
Comparison: Radiographs from 05/23/2017.

CLINICAL DATA: 21-year-old female with persistent left knee pain.

EXAM:
MRI OF THE LEFT KNEE WITHOUT CONTRAST
TECHNIQUE: Multiplanar, multisequence MR imaging of the knee was performed. No
intravenous contrast was administered.

[Series 3: pd_tse_fs_tra · axial · 4.0mm · 0.42mm/px · z∈[-44,+36]mm · 3 of 26 slices shown]
[im 4/26]
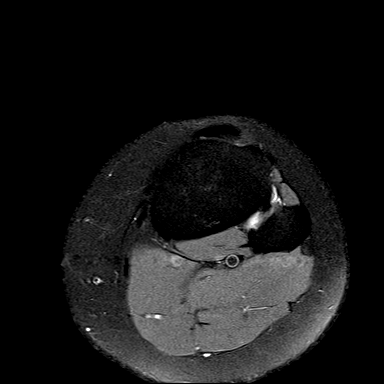
[im 13/26]
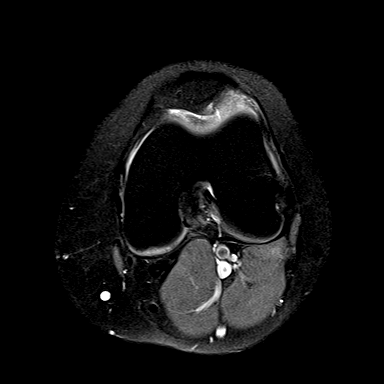
[im 22/26]
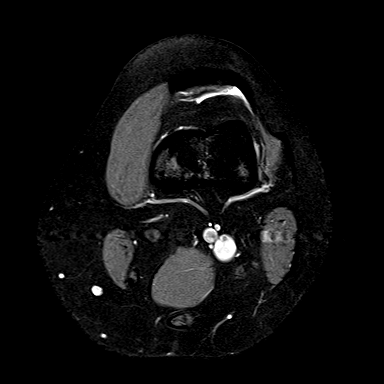

[Series 5: PD fat-sat · sagittal · 3.5mm · 0.25mm/px · 7 of 22 slices shown]
[im 1/22]
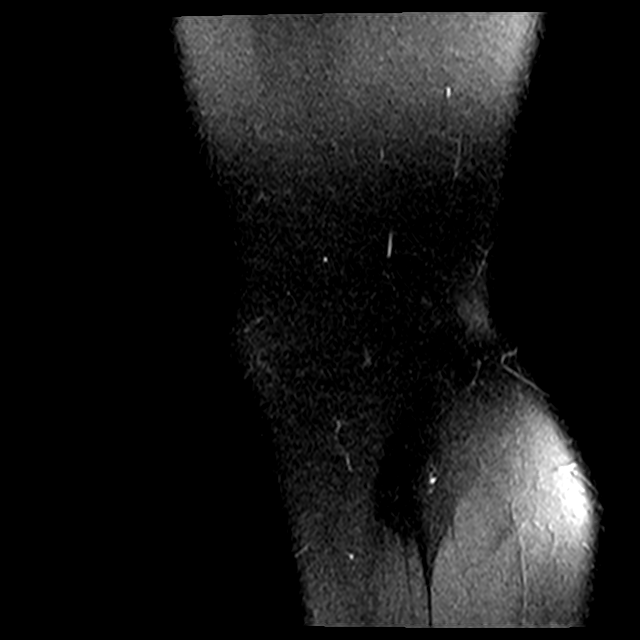
[im 4/22]
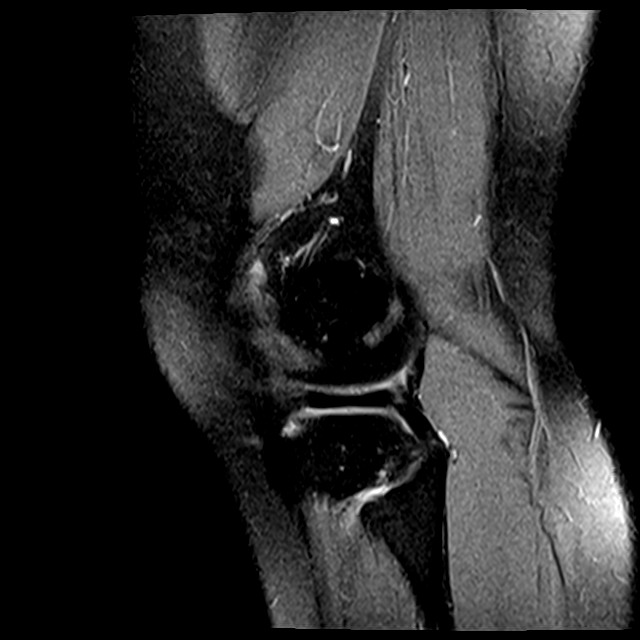
[im 8/22]
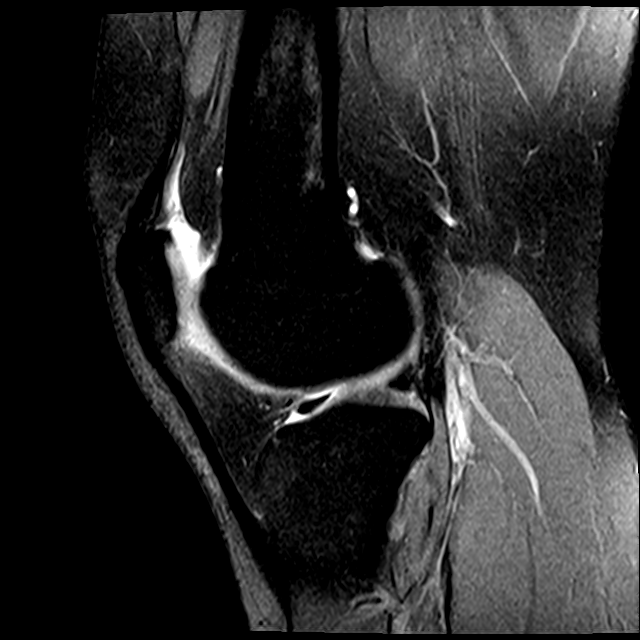
[im 11/22]
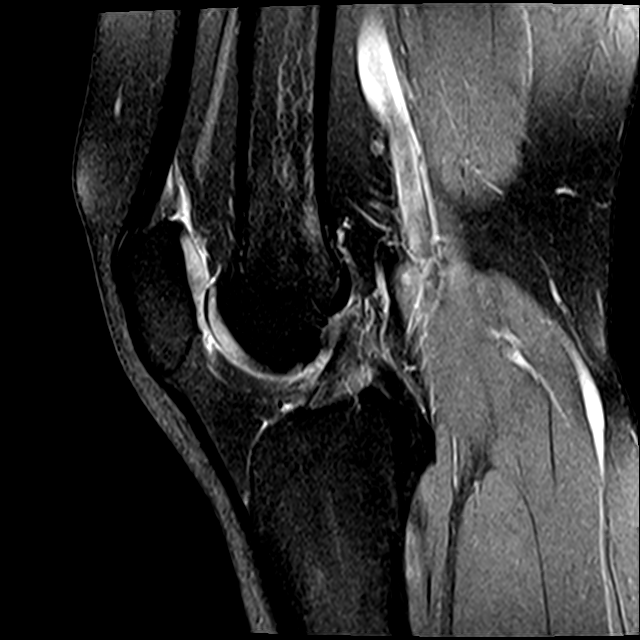
[im 15/22]
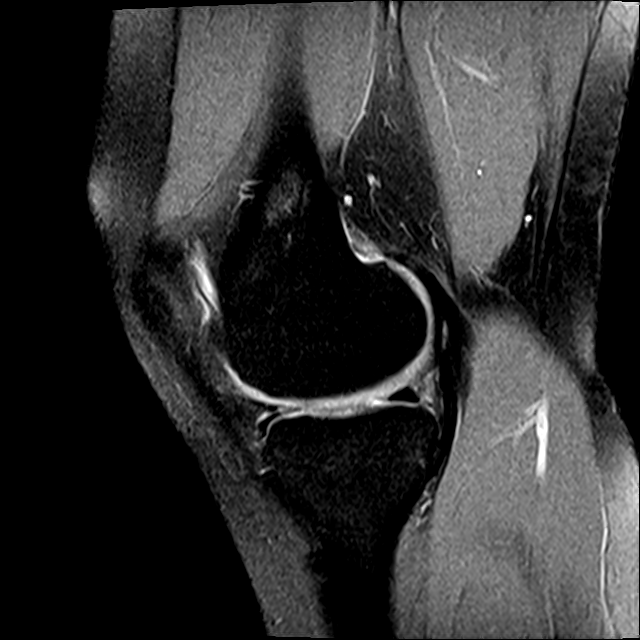
[im 18/22]
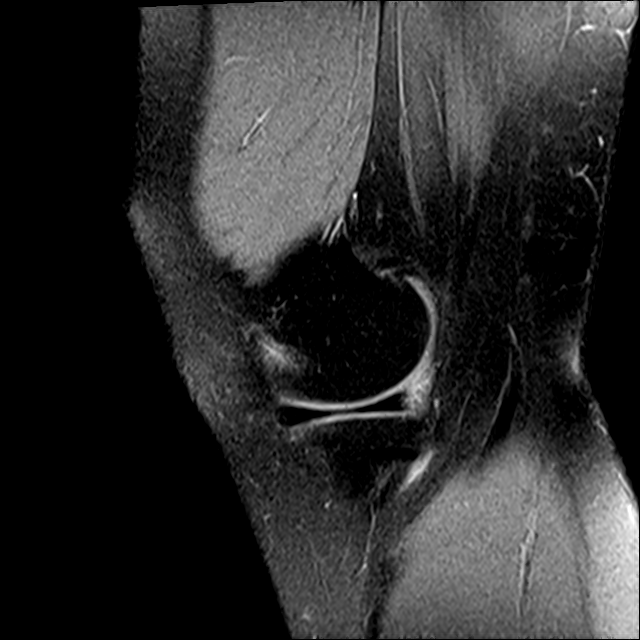
[im 22/22]
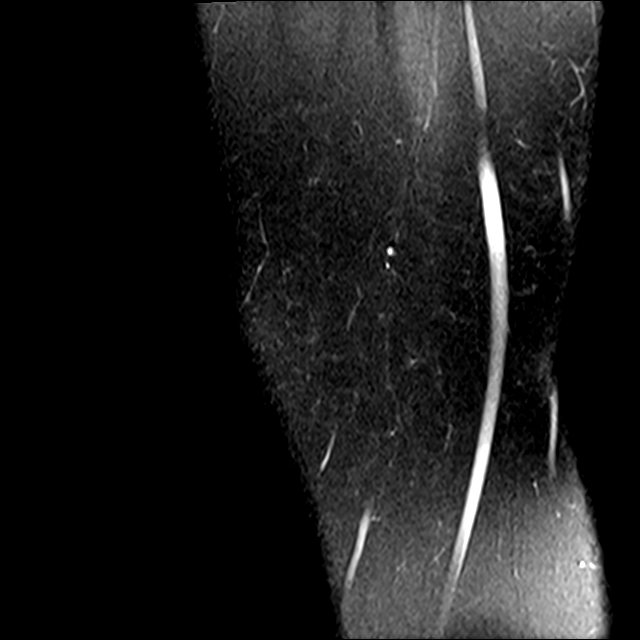

[Series 6: T2 fat-sat · coronal · 3.2mm · 0.62mm/px · 7 of 23 slices shown]
[im 1/23]
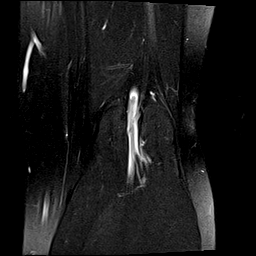
[im 4/23]
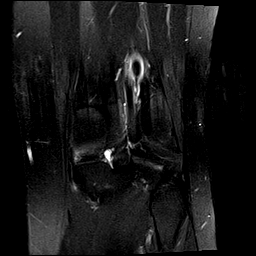
[im 8/23]
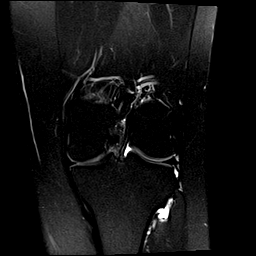
[im 12/23]
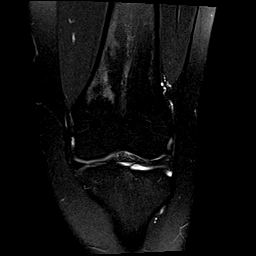
[im 15/23]
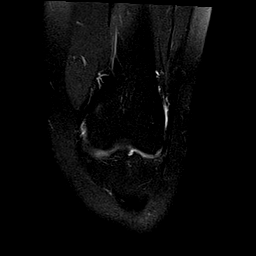
[im 19/23]
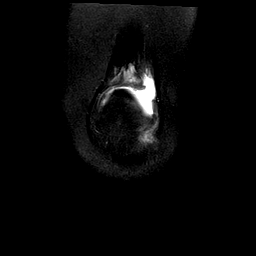
[im 23/23]
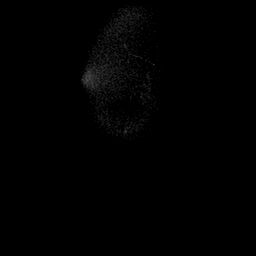

[Series 7: T1 · coronal · 3.2mm · 0.25mm/px · 3 of 23 slices shown]
[im 4/23]
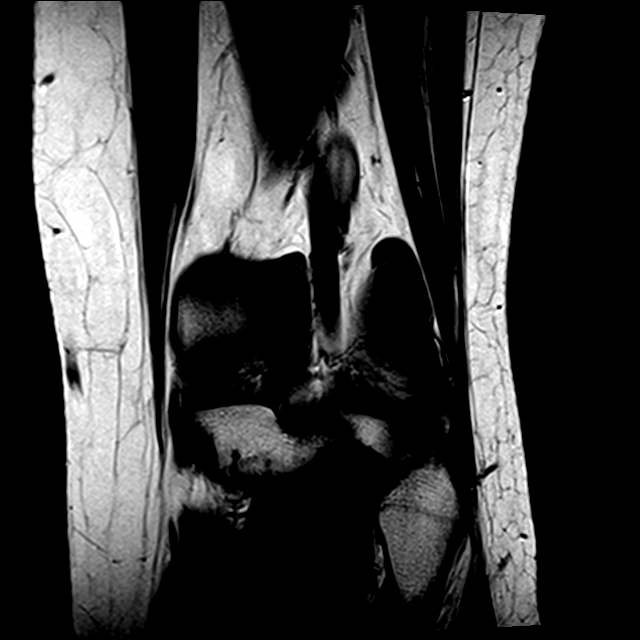
[im 12/23]
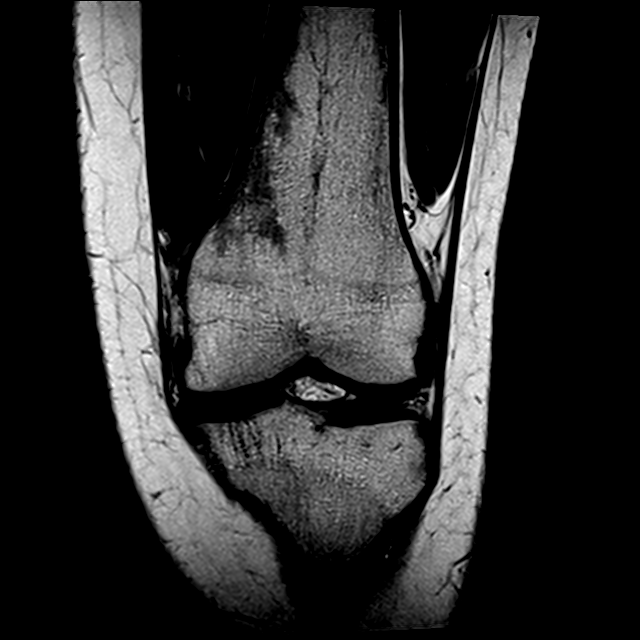
[im 19/23]
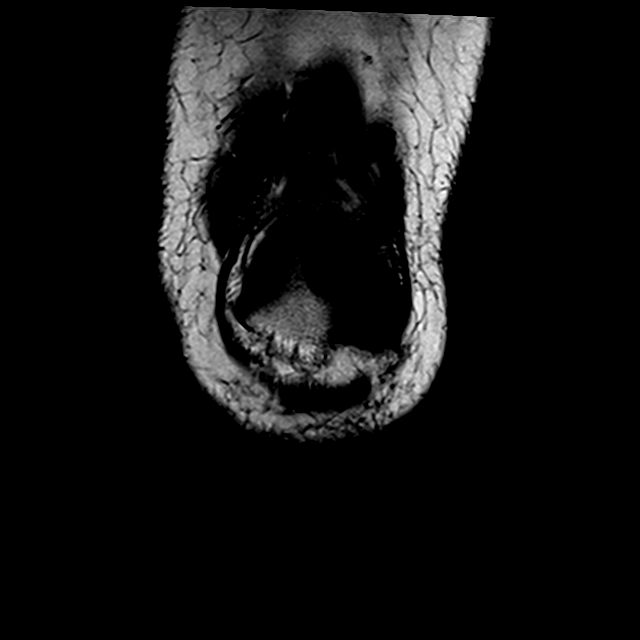

[20 of 40 positions shown; findings below may reference images not displayed]

FINDINGS: MENISCI

Medial meniscus: Horizontal inferior articular surfacing tear of the
anterior horn of the medial meniscus is suggested on series 5, image
9 with blunting of the free edge involving the body of the medial
meniscus consistent with tiny free edge tears.

Lateral meniscus:  Intact.

LIGAMENTS

Cruciates:  Intact ACL and PCL.

Collaterals: Medial collateral ligament is intact. Lateral
collateral ligament complex is intact.

CARTILAGE

Patellofemoral:  Intact without focal chondral defects.

Medial: Minimal irregular cartilage thinning of the medial
femorotibial compartment.

Lateral:  Intact

Joint:  No joint effusion. Normal Hoffa's fat. No plical thickening.

Popliteal Fossa:  No Baker cyst. Intact popliteus tendon.

Extensor Mechanism:  Intact quadriceps tendon and patellar tendon.

Bones: No focal marrow signal abnormality. No fracture or
dislocation.

Other: None
IMPRESSION: 1. Blunting of the free edge of the body of the medial meniscus
consistent with tiny free edge tears.
2. There appears to be a horizontal inferior articular surfacing
tear involving the anterior horn the medial meniscus on series 5,
image 9.
3. Intact cruciate and collateral ligaments.
4. Minimal irregular chondral thinning involving the medial
femorotibial compartment.

## 2020-05-30 ENCOUNTER — Other Ambulatory Visit: Payer: BC Managed Care – PPO

## 2022-09-03 NOTE — Progress Notes (Signed)
[facility] [place] Phone: [phone] Subjective:   Rito Ehrlich, am serving as a scribe for Dr. Hulan Saas.  I'm seeing this patient by the request  of:  Harrison Mons, PA  CC: knee pain   [id number]  [name] is a 26 y.o. female coming in with complaint of Left knee pain. Patient states that she went to [facility] and they told her all she needed was PT but it started having this pain more than intermittently. Patient's mother got a reference from some neighbors to come see our office. States has had a cortisone injection from [facility] and that lasted for about 5-6 months, but was told by them that this was just a band aid fix. Could not remember if she had x rays done or not knows there was a CT scan awhile back.   Knee does give out and will make her feel like she is going to fall. Patient knee is popping a lot. Denies any numbness does have tingling in both feet at her toes. Patient states that being active and running hurts worse. By the end of day is when the pain its worse. Patient states that she used to be heavy and dropped a ton of weight quickly and is not sure if that could be associated to the knee pain or if its just a sports injury. Patient locates pain to patella and medial knee. Most of the time pain is dull but when moving or is active the pain can get to be throbbing and stabbing. Denies radiation of pain.        Past Medical History:  Diagnosis Date   ADD (attention deficit disorder)    Allergy    Anxiety    Constitutional growth delay    Depression    Goiter    Hashimoto disease    Headache(784.0)    Obesity, unspecified 01/18/2013   ODD (oppositional defiant disorder)    PTSD (post-traumatic stress disorder)    Reactive attachment disorder    Past Surgical History:  Procedure Laterality Date   TONSILLECTOMY     age 13yo   TYMPANOSTOMY TUBE PLACEMENT     BMTT in infancy    Social History   Socioeconomic History   Marital status: Single    Spouse name: n/a   Number of children: 0   Years of education: Not on file   Highest education level: Not on file  Occupational History   Occupation: unemployed  Tobacco Use   Smoking status: Every Day    Packs/day: 1.00    Years: 1.00    Total pack years: 1.00    Types: Cigarettes   Smokeless tobacco: Never  Vaping Use   Vaping Use: Former  Substance and Sexual Activity   Alcohol use: No    Alcohol/week: 0.0 standard drinks of alcohol   Drug use: Yes    Types: Marijuana   Sexual activity: Yes    Comment: In a homosexual relationship  Other Topics Concern   Not on file  Social History Narrative   Lives with adoptive mom, dad and brother. Both Nixie and her brother, Kerry Dory, were adopted from [place]. Tea was adopted at age 46 months. She would like to find her birth mother when she turns 23. Finished high school in 2015.   Took some classes at [employer], looking for work. Meets with a Emergency planning/management officer.   Social Determinants of Health   Financial  Resource Strain: Not on file  Food Insecurity: Not on file  Transportation Needs: Not on file  Physical Activity: Not on file  Stress: Not on file  Social Connections: Not on file   Allergies  Allergen Reactions   Alprazolam Other (See Comments)    Makes er very angry and hostile   Nickel Rash   Family History  Adopted: Yes  Family history unknown: Yes    Current Outpatient Medications (Endocrine & Metabolic):    levothyroxine (SYNTHROID, LEVOTHROID) 150 MCG tablet, Take 150 mcg by mouth daily before breakfast.   Current Outpatient Medications (Cardiovascular):    prazosin (MINIPRESS) 2 MG capsule, Take 2 mg by mouth at bedtime. Reported on 03/12/2016   propranolol ER (INDERAL LA) 80 MG 24 hr capsule, Take 80 mg by mouth daily.     Current Outpatient Medications (Other):    amphetamine-dextroamphetamine (ADDERALL XR) 20 MG 24 hr  capsule, Take 40 mg by mouth daily.   diazepam (VALIUM) 5 MG tablet, Take 5-10 mg (1-2 tablets) 30 minutes before procedure   hydrOXYzine (ATARAX) 10 MG tablet, Take 10 mg by mouth 3 (three) times daily as needed.   lithium carbonate (ESKALITH) 450 MG CR tablet, Take 450 mg by mouth 2 (two) times daily.   Melatonin 3 MG TABS, Take 3 mg by mouth at bedtime.   sertraline (ZOLOFT) 100 MG tablet, Take 100 mg by mouth at bedtime. Reported on 03/12/2016   Reviewed prior external information including notes and imaging from  primary care provider As well as notes that were available from care everywhere and other healthcare systems.  Past medical history, social, surgical and family history all reviewed in electronic medical record.  No pertanent information unless stated regarding to the chief complaint.   Review of Systems:  No headache, visual changes, nausea, vomiting, diarrhea, constipation, dizziness, abdominal pain, skin rash, fevers, chills, night sweats, weight loss, swollen lymph nodes, chest pain, shortness of breath, mood changes. POSITIVE muscle aches, body aches, joint swelling  Objective  Blood pressure 126/82, pulse 74, height 5' 4.17" (1.63 m), weight 130 lb (59 kg), SpO2 98 %.   General: No apparent distress alert and oriented x3 mood and affect normal, dressed appropriately.  Patient is accompanied with mother who does speak for patient HEENT: Pupils equal, extraocular movements intact  Respiratory: Patient's speak in full sentences and does not appear short of breath  Cardiovascular: No lower extremity edema, non tender, no erythema  Patient's left knee tender to palpation over the lateral patellofemoral joint.  Mild pain over the anterior aspect of the knee to but very nonspecific. Full range of motion noted.  Significant crepitus noted though.  No significant instability.  Positive patellar grind test  Limited muscular skeletal ultrasound was performed and interpreted by  Hulan Saas, M  Limited ultrasound of patient's knee shows good articular cartilage of the patellofemoral joint.  Patient does have what appears to be some narrowing middle of the patella cartilage inferior meniscus appear to be unremarkable. Impression: Consistent with patellofemoral syndrome    Impression and Recommendations:    The above documentation has been reviewed and is accurate and complete Lyndal Pulley, DO

## 2022-09-05 ENCOUNTER — Ambulatory Visit (INDEPENDENT_AMBULATORY_CARE_PROVIDER_SITE_OTHER): Payer: Medicaid Other | Admitting: Family Medicine

## 2022-09-05 ENCOUNTER — Ambulatory Visit (INDEPENDENT_AMBULATORY_CARE_PROVIDER_SITE_OTHER): Payer: Medicaid Other

## 2022-09-05 VITALS — BP 126/82 | HR 74 | Ht 64.17 in | Wt 130.0 lb

## 2022-09-05 DIAGNOSIS — M222X2 Patellofemoral disorders, left knee: Secondary | ICD-10-CM

## 2022-09-05 DIAGNOSIS — M25562 Pain in left knee: Secondary | ICD-10-CM | POA: Diagnosis not present

## 2022-09-05 NOTE — Patient Instructions (Addendum)
Good to see you Get x ray on your way out Exercises given for the left knee (patella femoral) Tru pull lite brace to wear with activity Ice 20 minutes 2 times daily. Usually after activity and before bed. Voltaren OTC to use on the knee  Follow up in 6 weeks

## 2022-09-06 ENCOUNTER — Encounter: Payer: Self-pay | Admitting: Family Medicine

## 2022-09-06 DIAGNOSIS — M222X2 Patellofemoral disorders, left knee: Secondary | ICD-10-CM | POA: Insufficient documentation

## 2022-09-06 NOTE — Assessment & Plan Note (Signed)
Reviewed anatomy using anatomical model and how PFS occurs.  Given rehab exercises handout for VMO, hip abductors, core, entire kinetic chain including proprioception exercises.  Could benefit from PT, regular exercise, upright biking, and a PFS knee brace to assist with tracking abnormalities. Worsening pain can consider the possibility of formal physical therapy and injections.  Tru pull lite brace given.

## 2022-10-15 NOTE — Progress Notes (Signed)
[facility] [place] Phone: [phone] Subjective:   [name], am serving as a scribe for Dr. Hulan Saas.  I'm seeing this patient by the request  of:  Harrison Mons, PA  CC: left knee pain   [id number]  09/05/2022 Reviewed anatomy using anatomical model and how PFS occurs.   Given rehab exercises handout for VMO, hip abductors, core, entire kinetic chain including proprioception exercises.   Could benefit from PT, regular exercise, upright biking, and a PFS knee brace to assist with tracking abnormalities. Worsening pain can consider the possibility of formal physical therapy and injections.  Tru pull lite brace given.     Update 10/16/2022 [name] is a 27 y.o. female coming in with complaint of L knee pain. Patient states the knee has been sort of better. Patient has not been wearing the brace because she has not been to active since it has been cold out. The exercises she does do. Patient does have some left wrist pain that started two days that is different from the carpal tunnel. Patient locates pain to below palm of left hand. Patient has just been avoiding using that wrist.   Normal xrays 12/23 of left knee     Past Medical History:  Diagnosis Date   ADD (attention deficit disorder)    Allergy    Anxiety    Constitutional growth delay    Depression    Goiter    Hashimoto disease    Headache(784.0)    Obesity, unspecified 01/18/2013   ODD (oppositional defiant disorder)    PTSD (post-traumatic stress disorder)    Reactive attachment disorder    Past Surgical History:  Procedure Laterality Date   TONSILLECTOMY     age 78yo   TYMPANOSTOMY TUBE PLACEMENT     BMTT in infancy   Social History   Socioeconomic History   Marital status: Single    Spouse name: n/a   Number of children: 0   Years of education: Not on file   Highest education level: Not on file  Occupational History    Occupation: unemployed  Tobacco Use   Smoking status: Every Day    Packs/day: 1.00    Years: 1.00    Total pack years: 1.00    Types: Cigarettes   Smokeless tobacco: Never  Vaping Use   Vaping Use: Former  Substance and Sexual Activity   Alcohol use: No    Alcohol/week: 0.0 standard drinks of alcohol   Drug use: Yes    Types: Marijuana   Sexual activity: Yes    Comment: In a homosexual relationship  Other Topics Concern   Not on file  Social History Narrative   Lives with adoptive mom, dad and brother. Both Wyvonne and her brother, Kerry Dory, were adopted from [place]. Jess was adopted at age 14 months. She would like to find her birth mother when she turns 31. Finished high school in 2015.   Took some classes at [employer], looking for work. Meets with a Emergency planning/management officer.   Social Determinants of Health   Financial Resource Strain: Not on file  Food Insecurity: Not on file  Transportation Needs: Not on file  Physical Activity: Not on file  Stress: Not on file  Social Connections: Not on file   Allergies  Allergen Reactions   Alprazolam Other (See Comments)    Makes er very angry and hostile   Nickel Rash   Family History  Adopted: Yes  Family history unknown: Yes    Current Outpatient Medications (Endocrine & Metabolic):    levothyroxine (SYNTHROID, LEVOTHROID) 150 MCG tablet, Take 150 mcg by mouth daily before breakfast.   Current Outpatient Medications (Cardiovascular):    prazosin (MINIPRESS) 2 MG capsule, Take 2 mg by mouth at bedtime. Reported on 03/12/2016   propranolol ER (INDERAL LA) 80 MG 24 hr capsule, Take 80 mg by mouth daily.     Current Outpatient Medications (Other):    amphetamine-dextroamphetamine (ADDERALL XR) 20 MG 24 hr capsule, Take 40 mg by mouth daily.   diazepam (VALIUM) 5 MG tablet, Take 5-10 mg (1-2 tablets) 30 minutes before procedure   hydrOXYzine (ATARAX) 10 MG tablet, Take 10 mg by mouth 3 (three) times daily as needed.    lithium carbonate (ESKALITH) 450 MG CR tablet, Take 450 mg by mouth 2 (two) times daily.   Melatonin 3 MG TABS, Take 3 mg by mouth at bedtime.   sertraline (ZOLOFT) 100 MG tablet, Take 100 mg by mouth at bedtime. Reported on 03/12/2016   Reviewed prior external information including notes and imaging from  primary care provider As well as notes that were available from care everywhere and other healthcare systems.  Past medical history, social, surgical and family history all reviewed in electronic medical record.  No pertanent information unless stated regarding to the chief complaint.   Review of Systems:  No headache, visual changes, nausea, vomiting, diarrhea, constipation, dizziness, abdominal pain, skin rash, fevers, chills, night sweats, weight loss, swollen lymph nodes, body aches, joint swelling, chest pain, shortness of breath, mood changes. POSITIVE muscle aches  Objective  Blood pressure 90/60, pulse 61, height 5' 4.17" (1.63 m), weight 136 lb (61.7 kg), SpO2 98 %.   General: No apparent distress alert and oriented x3 mood and affect normal, dressed appropriately.  HEENT: Pupils equal, extraocular movements intact  Respiratory: Patient's speak in full sentences and does not appear short of breath  Cardiovascular: No lower extremity edema, non tender, no erythema  Left knee exam shows patient does still have some lateral tracking noted.  No significant swelling though noted.  Patient's right wrist is unremarkable the patient's left wrist is tender to palpation mostly over the flexor ulnar area.  Patient does have the postsurgical changes from her carpal tunnel.  Good grip strength noted.  No pain over the lunate bone.  No pain over the anatomical snuffbox    Impression and Recommendations:     The above documentation has been reviewed and is accurate and complete Lyndal Pulley, DO

## 2022-10-17 ENCOUNTER — Ambulatory Visit (INDEPENDENT_AMBULATORY_CARE_PROVIDER_SITE_OTHER): Payer: Medicaid Other | Admitting: Family Medicine

## 2022-10-17 VITALS — BP 90/60 | HR 61 | Ht 64.17 in | Wt 136.0 lb

## 2022-10-17 DIAGNOSIS — M25532 Pain in left wrist: Secondary | ICD-10-CM | POA: Diagnosis not present

## 2022-10-17 DIAGNOSIS — M222X2 Patellofemoral disorders, left knee: Secondary | ICD-10-CM

## 2022-10-17 NOTE — Patient Instructions (Addendum)
Good to see you  Take a break from foosball  When starting again in 7-10 days try to wrap wrist Knees doing much better Continue doing what you are doing  Follow up in 2 months if not feeling better

## 2022-10-17 NOTE — Assessment & Plan Note (Signed)
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  Patient does play a lot of fooseball and warned that the repetitive flipping of the wrist could be contributing to this.  Discussed with EP.  Ice but did not feel that exercises were significantly warranted.  Worsening pain can consider topical or oral anti-inflammatories and follow-up again in 8 weeks if not completely gone.

## 2022-10-17 NOTE — Assessment & Plan Note (Signed)
Patient is responding relatively well to the conservative therapy and bracing.  Encouraged her to be a little more compliant with some of the exercises and I think patient will continue to improve.  Worsening pain can consider injection but due to patient's young age and so far responding well to the conservative therapy hopeful that we will not need to do this.  Follow-up again in 8 weeks

## 2022-12-08 ENCOUNTER — Emergency Department (HOSPITAL_COMMUNITY): Payer: Medicaid Other

## 2022-12-08 ENCOUNTER — Emergency Department (HOSPITAL_COMMUNITY)
Admission: EM | Admit: 2022-12-08 | Discharge: 2022-12-08 | Disposition: A | Discharge status: Home / Self Care | Payer: Medicaid Other | Attending: Emergency Medicine | Admitting: Emergency Medicine

## 2022-12-08 ENCOUNTER — Other Ambulatory Visit: Payer: Self-pay

## 2022-12-08 DIAGNOSIS — R1013 Epigastric pain: Secondary | ICD-10-CM | POA: Insufficient documentation

## 2022-12-08 DIAGNOSIS — R1011 Right upper quadrant pain: Secondary | ICD-10-CM | POA: Insufficient documentation

## 2022-12-08 LAB — URINALYSIS, ROUTINE W REFLEX MICROSCOPIC
Bilirubin Urine: NEGATIVE
Glucose, UA: NEGATIVE mg/dL
Ketones, ur: 5 mg/dL — AB
Leukocytes,Ua: NEGATIVE
Nitrite: NEGATIVE
Protein, ur: 30 mg/dL — AB
RBC / HPF: >50 RBC/hpf (ref 0–5)
Specific Gravity, Urine: 1.018 (ref 1.005–1.030)
pH: 5 (ref 5.0–8.0)

## 2022-12-08 LAB — CBC WITH DIFFERENTIAL/PLATELET
Abs Immature Granulocytes: 0.03 10*3/uL (ref 0.00–0.07)
Basophils Absolute: 0 10*3/uL (ref 0.0–0.1)
Basophils Relative: 0 %
Eosinophils Absolute: 0.3 10*3/uL (ref 0.0–0.5)
Eosinophils Relative: 2 %
HCT: 46.4 % — ABNORMAL HIGH (ref 36.0–46.0)
Hemoglobin: 15.4 g/dL — ABNORMAL HIGH (ref 12.0–15.0)
Immature Granulocytes: 0 %
Lymphocytes Relative: 9 %
Lymphs Abs: 1.1 10*3/uL (ref 0.7–4.0)
MCH: 29.6 pg (ref 26.0–34.0)
MCHC: 33.2 g/dL (ref 30.0–36.0)
MCV: 89.1 fL (ref 80.0–100.0)
Monocytes Absolute: 0.7 10*3/uL (ref 0.1–1.0)
Monocytes Relative: 6 %
Neutro Abs: 10.2 10*3/uL — ABNORMAL HIGH (ref 1.7–7.7)
Neutrophils Relative %: 83 %
Platelets: 285 10*3/uL (ref 150–400)
RBC: 5.21 MIL/uL — ABNORMAL HIGH (ref 3.87–5.11)
RDW: 13.3 % (ref 11.5–15.5)
WBC: 12.3 10*3/uL — ABNORMAL HIGH (ref 4.0–10.5)
nRBC: 0 % (ref 0.0–0.2)

## 2022-12-08 LAB — COMPREHENSIVE METABOLIC PANEL
ALT: 18 U/L (ref 0–44)
AST: 22 U/L (ref 15–41)
Albumin: 4.3 g/dL (ref 3.5–5.0)
Alkaline Phosphatase: 76 U/L (ref 38–126)
Anion gap: 10 (ref 5–15)
BUN: 12 mg/dL (ref 6–20)
CO2: 23 mmol/L (ref 22–32)
Calcium: 8.9 mg/dL (ref 8.9–10.3)
Chloride: 103 mmol/L (ref 98–111)
Creatinine, Ser: 0.8 mg/dL (ref 0.44–1.00)
GFR, Estimated: >60 mL/min (ref >60)
Glucose, Bld: 100 mg/dL — ABNORMAL HIGH (ref 70–99)
Potassium: 4.6 mmol/L (ref 3.5–5.1)
Sodium: 136 mmol/L (ref 135–145)
Total Bilirubin: 0.7 mg/dL (ref 0.3–1.2)
Total Protein: 7.5 g/dL (ref 6.5–8.1)

## 2022-12-08 LAB — HCG, QUANTITATIVE, PREGNANCY: hCG, Beta Chain, Quant, S: <1 m[IU]/mL (ref <5)

## 2022-12-08 LAB — LIPASE, BLOOD: Lipase: 43 U/L (ref 11–51)

## 2022-12-08 LAB — CBG MONITORING, ED: Glucose-Capillary: 94 mg/dL (ref 70–99)

## 2022-12-08 MED ORDER — SODIUM CHLORIDE 0.9 % IV BOLUS
1000.0000 mL | Freq: Once | INTRAVENOUS | Status: AC
  Administered 2022-12-08: 1000 mL via INTRAVENOUS

## 2022-12-08 NOTE — ED Triage Notes (Signed)
Pt reports RLQ pain x 3 days. Denies n/v/d

## 2022-12-08 NOTE — ED Notes (Signed)
Patient states she has anxiety and needs her meds for the same

## 2022-12-08 NOTE — ED Provider Notes (Signed)
[place] [facility] Provider Note   CSN: [id number] Arrival date & time: 12/08/22  1037     History  Chief Complaint  Patient presents with   Abdominal Pain    [name] is a 27 y.o. female. With past medical history of ADHD, hashimotos thyroditis, SI/salicylate OD, bipolar disorder who presents to the emergency department with abdominal pain.  She states that symptoms began on Thursday with right upper quadrant abdominal pain that she describes as cramping. She states that the pain has been progressively worsening and she now describes it as dull and achy. It is constant. The pain is non-radiating. Worsened pain with deep breaths. She has been taking ibuprofen with no improvement in her symptoms. She states she has had kidney infection before but they did not feel this way. She denies nausea, vomiting, diarrhea, dysuria, vaginal discharge, fevers.    Abdominal Pain      Home Medications Prior to Admission medications   Medication Sig Start Date End Date Taking? Authorizing Provider  amphetamine-dextroamphetamine (ADDERALL XR) 20 MG 24 hr capsule Take 40 mg by mouth daily. 12/14/17   [provider]  diazepam (VALIUM) 5 MG tablet Take 5-10 mg (1-2 tablets) 30 minutes before procedure 01/09/18   Harrison Mons, PA  hydrOXYzine (ATARAX) 10 MG tablet Take 10 mg by mouth 3 (three) times daily as needed. 06/22/18   [provider]  levothyroxine (SYNTHROID, LEVOTHROID) 150 MCG tablet Take 150 mcg by mouth daily before breakfast.  04/05/16   [provider]  lithium carbonate (ESKALITH) 450 MG CR tablet Take 450 mg by mouth 2 (two) times daily.    [provider]  Melatonin 3 MG TABS Take 3 mg by mouth at bedtime.    [provider]  prazosin (MINIPRESS) 2 MG capsule Take 2 mg by mouth at bedtime. Reported on 03/12/2016    [provider]  propranolol ER (INDERAL LA) 80 MG 24 hr capsule Take 80 mg  by mouth daily. 08/15/22   [provider]  sertraline (ZOLOFT) 100 MG tablet Take 100 mg by mouth at bedtime. Reported on 03/12/2016    [provider]      Allergies    Alprazolam and Nickel    Review of Systems   Review of Systems  Gastrointestinal:  Positive for abdominal pain.  All other systems reviewed and are negative.   Physical Exam Updated Vital Signs BP (!) 132/92   Pulse 93   Temp 97.7 F (36.5 C) (Oral)   Resp 18   Ht '5\' 4"'$  (1.626 m)   Wt 61.2 kg   LMP 12/06/2022   SpO2 96%   BMI 23.17 kg/m  Physical Exam Vitals and nursing note reviewed.  Constitutional:      General: She is not in acute distress.    Appearance: Normal appearance. She is well-developed and normal weight. She is ill-appearing.  HENT:     Head: Normocephalic.     Mouth/Throat:     Mouth: Mucous membranes are dry.     Pharynx: Oropharynx is clear.  Eyes:     General: No scleral icterus.    Extraocular Movements: Extraocular movements intact.  Cardiovascular:     Rate and Rhythm: Normal rate and regular rhythm.     Heart sounds: Normal heart sounds. No murmur heard. Pulmonary:     Effort: Pulmonary effort is normal. No respiratory distress.     Breath sounds: Normal breath sounds.  Abdominal:  General: Abdomen is protuberant. Bowel sounds are normal. There is no distension.     Palpations: Abdomen is soft.     Tenderness: There is abdominal tenderness in the right upper quadrant and epigastric area. There is no right CVA tenderness, guarding or rebound. Positive signs include Murphy's sign.  Skin:    General: Skin is warm and dry.     Capillary Refill: Capillary refill takes less than 2 seconds.  Neurological:     General: No focal deficit present.     Mental Status: She is alert and oriented to person, place, and time. Mental status is at baseline.  Psychiatric:        Mood and Affect: Mood is anxious.        Behavior: Behavior normal.     ED Results /  Procedures / Treatments   Labs (all labs ordered are listed, but only abnormal results are displayed) Labs Reviewed  COMPREHENSIVE METABOLIC PANEL - Abnormal; Notable for the following components:      Result Value   Glucose, Bld 100 (*)    All other components within normal limits  CBC WITH DIFFERENTIAL/PLATELET - Abnormal; Notable for the following components:   WBC 12.3 (*)    RBC 5.21 (*)    Hemoglobin 15.4 (*)    HCT 46.4 (*)    Neutro Abs 10.2 (*)    All other components within normal limits  URINALYSIS, ROUTINE W REFLEX MICROSCOPIC - Abnormal; Notable for the following components:   APPearance HAZY (*)    Hgb urine dipstick LARGE (*)    Ketones, ur 5 (*)    Protein, ur 30 (*)    Bacteria, UA RARE (*)    All other components within normal limits  LIPASE, BLOOD  HCG, QUANTITATIVE, PREGNANCY  CBG MONITORING, ED    EKG None  Radiology US Abdomen Limited RUQ (LIVER/GB)  Result Date: 12/08/2022 CLINICAL DATA:  27 year old female with epigastric abdominal pain. EXAM: ULTRASOUND ABDOMEN LIMITED RIGHT UPPER QUADRANT COMPARISON:  Abdomen ultrasound 11/25/2017. CT Abdomen and Pelvis 06/06/2015. FINDINGS: Gallbladder: No gallstones or wall thickening visualized. No sonographic Murphy sign noted by sonographer. Common bile duct: Diameter: 1 mm, normal. Liver: No focal lesion identified. Within normal limits in parenchymal echogenicity. Portal vein is patent on color Doppler imaging with normal direction of blood flow towards the liver. Other: Negative visible right kidney.  No free fluid. IMPRESSION: Normal right upper quadrant ultrasound. Electronically Signed   By: Genevie Ann M.D.   On: 12/08/2022 12:13    Procedures Procedures   Medications Ordered in ED Medications  sodium chloride 0.9 % bolus 1,000 mL (0 mLs Intravenous Stopped 12/08/22 1308)    ED Course/ Medical Decision Making/ A&P    Medical Decision Making Amount and/or Complexity of Data Reviewed Labs:  ordered. Radiology: ordered.  Initial Impression and Ddx 27 year old female who presents to the emergency department with abdominal pain. Patient PMH that increases complexity of ED encounter:  ODD, hashimotos, bipolar  Differential: Acute hepatobiliary disease, pancreatitis, appendicitis, PUD, gastritis, SBO, diverticulitis, colitis, viral gastroenteritis, Crohn's, UC, vascular catastrophe, UTI, pyelonephritis, renal stone, obstructed stone, infected stone, ovarian torsion, ectopic pregnancy, TOA, PID, STD, etc.   Interpretation of Diagnostics I independent reviewed and interpreted the labs as followed: cbc shows likely hemoconcentration, lipase negative, not pregnant, ua with hematuria, cmp wnl   - I independently visualized the following imaging with scope of interpretation limited to determining acute life threatening conditions related to emergency care: RUQ [place], which revealed no  acute findings  Patient Reassessment and Ultimate Disposition/Management  27 year old who presents to the emergency department with abdominal pain. She is primarily complaining of RUQ abdominal pain that wraps around to the right flank. Overall she is nonseptic, non toxic appearing. She is afebrile and hemodynamically stable.  Obtained labs including lipase, UA. RUQ ultrasound.   - cbc appears hemoconcentrated, will give IVF.  - RUQ ultrasound negative. Pending further labs.  - prior to completion of labs patient is requesting to leave. I have not completed her evaluation. Her RUQ ultrasound does not show any acute hepatobiliary disease. Lipase is negative. No AKI. Electrolytes wnl. She is not pregnant.   I still have suspicion for possible renal stone or appendicitis that I cannot rule out without further imaging.   The patient has decided to leave against medical advice.  The patient had a normal mental status examination and understands her condition and the risks of leaving including worsening condition,  permanent disability and death. The patient has had an opportunity to ask questions about her medical condition. The patient has been informed  that she may return for care at any time and has been referred to her primary physician. The patient's family member was present for the conversation.   Patient management required discussion with the following services or consulting groups:  None  Complexity of Problems Addressed Acute complicated illness or Injury  Additional Data Reviewed and Analyzed Further history obtained from: Further history from spouse/family member, Past medical history and medications listed in the EMR, Care Everywhere, and Prior labs/imaging results  Patient Encounter Risk Assessment None  Final Clinical Impression(s) / ED Diagnoses Final diagnoses:  Right upper quadrant abdominal pain    Rx / DC Orders ED Discharge Orders     None         Mickie Hillier, PA-C 12/10/22 0650    Audley Hose, MD 12/10/22 1207

## 2022-12-08 NOTE — Discharge Instructions (Signed)
You were seen in the emergency department today for abdominal pain. You have decided to leave prior to [place] finishing evaluating you. Please return if your symptoms continue to worsen. Please pick up the antibiotics prescribed to you by the urgent care and complete those.

## 2022-12-11 NOTE — Progress Notes (Signed)
[place] Phone: [phone] Subjective:   [name], am serving as a scribe for Dr. Hulan Lee. 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  Patient does play a lot of fooseball and warned that the repetitive flipping of the wrist could be contributing to this.  Discussed with EP.  Ice but did not feel that exercises were significantly warranted.  Worsening pain can consider topical or oral anti-inflammatories and follow-up again in 8 weeks if not completely gone   Patient is responding relatively well to the conservative therapy and bracing. Encouraged her to be a little more compliant with some of the exercises and I think patient will continue to improve. Worsening pain can consider injection but due to patient's young age and so far responding well to the conservative therapy hopeful that we will not need to do this. Follow-up again in 8 weeks   Updated 12/12/2022 [name] is a 27 y.o. female coming in with complaint of wrist and knee pain. Knee pain has subsided but wrist pain start again 2 days ago. Pain over L medial forearm. Worse pain with use of her hand. Has not been playing fooseball recently. Tried Coban wrap but this did not help.      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   Social History   Socioeconomic History    Marital status: Single    Spouse name: n/a   Number of children: 0   Years of education: Not on file   Highest education level: Not on file  Occupational History   Occupation: unemployed  Tobacco Use   Smoking status: Every Day    Packs/day: 1.00    Years: 1.00    Additional pack years: 0.00    Total pack years: 1.00    Types: Cigarettes   Smokeless tobacco: Never  Vaping Use   Vaping Use: Former  Substance and Sexual Activity   Alcohol use: Lee    Alcohol/week: 0.0 standard drinks of alcohol   Drug use: Yes    Types: Marijuana   Sexual activity: Yes    Comment: In a homosexual relationship  Other Topics Concern   Not on file  Social History Narrative   Lives with adoptive mom, dad and brother. Both [name] and her brother, [name], were adopted from [name]. [name] was adopted at age 43 months. She would like to find her birth mother when she turns 58. Finished high school in 2015.   Took some classes at [employer], looking for work. Meets with a Emergency planning/management officer.   Social Determinants of Health   Financial Resource Strain: Not on file  Food Insecurity: Not on file  Transportation Needs: Not on file  Physical Activity: Not on file  Stress:  Not on file  Social Connections: Not on file   Allergies  Allergen Reactions   Alprazolam Other (See Comments)    Makes er very angry and hostile   Nickel Rash   Family History  Adopted: Yes  Family history unknown: Yes    Current Outpatient Medications (Endocrine & Metabolic):    levothyroxine (SYNTHROID, LEVOTHROID) 150 MCG tablet, Take 150 mcg by mouth daily before breakfast.   Current Outpatient Medications (Cardiovascular):    nitroGLYCERIN (NITRO-DUR) 0.2 mg/hr patch, Apply 1/4 of a patch to skin once daily.   prazosin (MINIPRESS) 2 MG capsule, Take 2 mg by mouth at bedtime. Reported on 03/12/2016   propranolol ER (INDERAL LA) 80 MG 24 hr capsule, Take 80 mg by mouth daily.     Current Outpatient  Medications (Other):    amphetamine-dextroamphetamine (ADDERALL XR) 20 MG 24 hr capsule, Take 40 mg by mouth daily.   diazepam (VALIUM) 5 MG tablet, Take 5-10 mg (1-2 tablets) 30 minutes before procedure   hydrOXYzine (ATARAX) 10 MG tablet, Take 10 mg by mouth 3 (three) times daily as needed.   lithium carbonate (ESKALITH) 450 MG CR tablet, Take 450 mg by mouth 2 (two) times daily.   Melatonin 3 MG TABS, Take 3 mg by mouth at bedtime.   sertraline (ZOLOFT) 100 MG tablet, Take 100 mg by mouth at bedtime. Reported on 03/12/2016   Reviewed prior external information including notes and imaging from  primary care provider As well as notes that were available from care everywhere and other healthcare systems.  Patient was seen in the emergency room on March 10 for abdominal pain, had a right upper quadrant ultrasound that was unremarkable, patient then decided to leave [name], patient did have hematuria in her urine  Past medical history, social, surgical and family history all reviewed in electronic medical record.  Lee pertanent information unless stated regarding to the chief complaint.   Review of Systems:  Lee headache, visual changes, nausea, vomiting, diarrhea, constipation, dizziness, abdominal pain, skin rash, fevers, chills, night sweats, weight loss, swollen lymph nodes, body aches, joint swelling, chest pain, shortness of breath, mood changes. POSITIVE muscle aches  Objective  Blood pressure 120/74, pulse 64, height 5\' 4"  (1.626 m), last menstrual period 12/06/2022, SpO2 98 %.   General: Lee apparent distress alert and oriented x3 mood and affect normal, dressed appropriately.  HEENT: Pupils equal, extraocular movements intact  Respiratory: Patient's speak in full sentences and does not appear short of breath  Cardiovascular: Lee lower extremity edema, non tender, Lee erythema  Wrist exam shows left wrist exam still has tenderness noted over the palmar aspect mostly in the  third and fourth compartments.  Good grip strength noted.  Knee exam shows significant improvement noted.  Lee significant erythema noted. Lee pain on exam either Limited muscular skeletal ultrasound was performed and interpreted by [name], M  Limited ultrasound shows there are some mild hypoechoic changes and increasing in neovascularization in Doppler flow of the third and fourth palmar compartments of the wrist.  Lee specific tearing noted. Impression: Nonspecific and     Impression and Recommendations:     The above documentation has been reviewed and is accurate and complete Lyndal Pulley, DO

## 2022-12-12 ENCOUNTER — Ambulatory Visit (INDEPENDENT_AMBULATORY_CARE_PROVIDER_SITE_OTHER): Payer: Medicaid Other | Admitting: Family Medicine

## 2022-12-12 ENCOUNTER — Other Ambulatory Visit: Payer: Self-pay

## 2022-12-12 VITALS — BP 120/74 | HR 64 | Ht 64.0 in

## 2022-12-12 DIAGNOSIS — M222X2 Patellofemoral disorders, left knee: Secondary | ICD-10-CM | POA: Diagnosis not present

## 2022-12-12 DIAGNOSIS — M25532 Pain in left wrist: Secondary | ICD-10-CM | POA: Diagnosis not present

## 2022-12-12 MED ORDER — NITROGLYCERIN 0.2 MG/HR TD PT24: MEDICATED_PATCH | TRANSDERMAL | 0 refills | Status: DC

## 2022-12-12 NOTE — Patient Instructions (Addendum)
Good to see you Nitroglycerin Protocol   Apply 1/4 nitroglycerin patch to affected area daily.  Change position of patch within the affected area every 24 hours.  You may experience a headache during the first 1-2 weeks of using the patch, these should subside.  If you experience headaches after beginning nitroglycerin patch treatment, you may take your preferred over the counter pain reliever.  Another side effect of the nitroglycerin patch is skin irritation or rash related to patch adhesive.  Please notify our office if you develop more severe headaches or rash, and stop the patch.  Tendon healing with nitroglycerin patch may require 12 to 24 weeks depending on the extent of injury.  Men should not use if taking Viagra, Cialis, or Levitra.   Do not use if you have migraines or rosacea.  Ice 20 minutes 2 times daily. Usually after activity and before bed. See me again in 6-8 weeks

## 2022-12-13 ENCOUNTER — Encounter: Payer: Self-pay | Admitting: Family Medicine

## 2022-12-13 NOTE — Assessment & Plan Note (Signed)
I believe the patient is doing significantly better at this time continue to work on [employer] strengthening for stability.  Follow-up again in 6 to 8 weeks

## 2022-12-13 NOTE — Assessment & Plan Note (Signed)
Still seems to be some mild tendinosis.  May be some potentially intersubstance tearing based on the neovascularization but none appreciated on the ultrasound today.  We did discuss different treatment options with patient as well as mother and we did decide on nitroglycerin patches.  Warned of potential side effects.  We discussed placement, we discussed home exercises, avoiding certain repetitive activities and follow-up with me again in 6 to 8 weeks

## 2023-02-05 ENCOUNTER — Ambulatory Visit: Payer: Medicaid Other | Admitting: Family Medicine

## 2023-04-08 ENCOUNTER — Other Ambulatory Visit: Payer: Self-pay | Admitting: Family Medicine

## 2023-08-12 NOTE — Progress Notes (Unsigned)
[facility] [address] [place] [address] Phone: [phone] Subjective:   Bruce Donath, am serving as a scribe for Dr. Antoine Primas.  I'm seeing this patient by the request  of:  Porfirio Oar, PA  CC: Left knee pain  JOA:[id number]  12/12/2022 Still seems to be some mild tendinosis.  May be some potentially intersubstance tearing based on the neovascularization but none appreciated on the ultrasound today.  We did discuss different treatment options with patient as well as mother and we did decide on nitroglycerin patches.  Warned of potential side effects.  We discussed placement, we discussed home exercises, avoiding certain repetitive activities and follow-up with me again in 6 to 8 weeks     I believe the patient is doing significantly better at this time continue to work on VMO strengthening for stability.  Follow-up again in 6 to 8 weeks     Updated 08/13/2023 [name] is a 27 y.o. female coming in with complaint of L knee pain. Patient states that her pain is beneath patella. Down hill increases her pain. States that knee will give out on her.        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   Social History   Socioeconomic History   Marital status: Single    Spouse name: n/a   Number of children: 0   Years of education: Not on file   Highest education level: Not on file  Occupational History   Occupation: unemployed  Tobacco Use   Smoking status: Every Day    Current packs/day: 1.00     Average packs/day: 1 pack/day for 1 year (1.0 ttl pk-yrs)    Types: Cigarettes   Smokeless tobacco: Never  Vaping Use   Vaping status: Former  Substance and Sexual Activity   Alcohol use: No    Alcohol/week: 0.0 standard drinks of alcohol   Drug use: Yes    Types: Marijuana   Sexual activity: Yes    Comment: In a homosexual relationship  Other Topics Concern   Not on file  Social History Narrative   Lives with adoptive mom, dad and brother. Both Balie and her brother, Jerilynn Som, were adopted from [place]. Khani was adopted at age 23 months. She would like to find her birth mother when she turns 25. Finished high school in 2015.   Took some classes at [employer], looking for work. Meets with a Psychiatric nurse.   Social Determinants of Health   Financial Resource Strain: Low Risk  (03/07/2023)   Received from [facility]   Overall Financial Resource Strain (CARDIA)    Difficulty of Paying Living Expenses: Not hard at all  Food Insecurity: No Food Insecurity (03/07/2023)   Received from [facility]   Hunger Vital Sign    Worried About Running Out of Food in the Last Year: Never true    Ran Out of Food in the Last Year: Never  true  Transportation Needs: No Transportation Needs (03/07/2023)   Received from [facility] - Transportation    Lack of Transportation (Medical): No    Lack of Transportation (Non-Medical): No  Physical Activity: Sufficiently Active (03/07/2023)   Received from [facility]   Exercise Vital Sign    Days of Exercise per Week: 7 days    Minutes of Exercise per Session: 30 min  Stress: No Stress Concern Present (03/07/2023)   Received from [facility] - Occupational Stress Questionnaire    Feeling of Stress : Not at all  Social Connections: Socially Isolated (03/07/2023)   Received from [facility]   Social Connection and Isolation Panel [NHANES]    Frequency of Communication with Friends  and Family: More than three times a week    Frequency of Social Gatherings with Friends and Family: More than three times a week    Attends Religious Services: Never    Database administrator or Organizations: No    Attends Engineer, structural: Never    Marital Status: Never married   Allergies  Allergen Reactions   Alprazolam Other (See Comments)    Makes er very angry and hostile   Nickel Rash   Family History  Adopted: Yes  Family history unknown: Yes    Current Outpatient Medications (Endocrine & Metabolic):    levothyroxine (SYNTHROID, LEVOTHROID) 150 MCG tablet, Take 150 mcg by mouth daily before breakfast.   Current Outpatient Medications (Cardiovascular):    nitroGLYCERIN (NITRO-DUR) 0.2 mg/hr patch, Apply 1/4 of a patch to skin once daily.   prazosin (MINIPRESS) 2 MG capsule, Take 2 mg by mouth at bedtime. Reported on 03/12/2016   propranolol ER (INDERAL LA) 80 MG 24 hr capsule, Take 80 mg by mouth daily.   Current Outpatient Medications (Analgesics):    meloxicam (MOBIC) 15 MG tablet, Take 1 tablet (15 mg total) by mouth daily.   Current Outpatient Medications (Other):    amphetamine-dextroamphetamine (ADDERALL XR) 20 MG 24 hr capsule, Take 40 mg by mouth daily.   diazepam (VALIUM) 5 MG tablet, Take 5-10 mg (1-2 tablets) 30 minutes before procedure   hydrOXYzine (ATARAX) 10 MG tablet, Take 10 mg by mouth 3 (three) times daily as needed.   lithium carbonate (ESKALITH) 450 MG CR tablet, Take 450 mg by mouth 2 (two) times daily.   Melatonin 3 MG TABS, Take 3 mg by mouth at bedtime.   sertraline (ZOLOFT) 100 MG tablet, Take 100 mg by mouth at bedtime. Reported on 03/12/2016   Reviewed prior external information including notes and imaging from  primary care provider As well as notes that were available from care everywhere and other healthcare systems.  Past medical history, social, surgical and family history all reviewed in electronic medical record.  No  pertanent information unless stated regarding to the chief complaint.   Review of Systems:  No headache, visual changes, nausea, vomiting, diarrhea, constipation, dizziness, abdominal pain, skin rash, fevers, chills, night sweats, weight loss, swollen lymph nodes, body aches, joint swelling, chest pain, shortness of breath, mood changes. POSITIVE muscle aches  Objective  Blood pressure 104/68, pulse 67, height 5\' 4"  (1.626 m), weight 140 lb (63.5 kg), SpO2 97%.   General: No apparent distress alert and oriented x3 mood and affect normal, dressed appropriately.  HEENT: Pupils equal, extraocular movements intact  Respiratory: Patient's speak in full sentences and does not appear short of breath  Cardiovascular: No lower  extremity edema, non tender, no erythema  Left knee exam does have some crepitus noted.  Some lateral tracking noted.  Positive patellar grind test noted.  No other significant findings of instability noted.   Limited muscular skeletal ultrasound was performed and interpreted by Antoine Primas, M   Limited ultrasound does show some hypoechoic changes noted.  Seems to be in the patellofemoral space.  No loose bodies noted.  Meniscus appear to be unremarkable. Impression: Effusion of the patellofemoral joint   Impression and Recommendations:    The above documentation has been reviewed and is accurate and complete Judi Saa, DO

## 2023-08-13 ENCOUNTER — Other Ambulatory Visit: Payer: Self-pay

## 2023-08-13 ENCOUNTER — Ambulatory Visit: Payer: Medicaid Other | Admitting: Family Medicine

## 2023-08-13 VITALS — BP 104/68 | HR 67 | Ht 64.0 in | Wt 140.0 lb

## 2023-08-13 DIAGNOSIS — M222X2 Patellofemoral disorders, left knee: Secondary | ICD-10-CM | POA: Diagnosis not present

## 2023-08-13 DIAGNOSIS — M25562 Pain in left knee: Secondary | ICD-10-CM | POA: Diagnosis not present

## 2023-08-13 MED ORDER — MELOXICAM 15 MG PO TABS: 15.0000 mg | ORAL_TABLET | Freq: Every day | ORAL | 0 refills | Status: DC

## 2023-08-13 NOTE — Patient Instructions (Signed)
Meloxicam 15mg  daily for 10 days then as needed Exercises Ice Worsening please contact me Brace with exercise See me in 2 months

## 2023-08-14 ENCOUNTER — Encounter: Payer: Self-pay | Admitting: Family Medicine

## 2023-08-14 NOTE — Assessment & Plan Note (Signed)
Chronic, with exacerbation, did do very well with physical therapy previously.  Encouraged her to consider this again, go back to bracing for different activities.  Discussed icing regimen and given anti-inflammatories.  Warned of potential side effects.  Patient should increase activity slowly.  Follow-up with me again in 6 to 8 weeks otherwise.

## 2023-08-30 ENCOUNTER — Emergency Department (HOSPITAL_COMMUNITY): Payer: Medicaid Other

## 2023-08-30 ENCOUNTER — Other Ambulatory Visit: Payer: Self-pay

## 2023-08-30 ENCOUNTER — Emergency Department (HOSPITAL_COMMUNITY)
Admission: EM | Admit: 2023-08-30 | Discharge: 2023-08-30 | Disposition: A | Discharge status: Home / Self Care | Payer: Medicaid Other | Attending: Emergency Medicine | Admitting: Emergency Medicine

## 2023-08-30 ENCOUNTER — Encounter (HOSPITAL_COMMUNITY): Payer: Self-pay

## 2023-08-30 DIAGNOSIS — R569 Unspecified convulsions: Secondary | ICD-10-CM | POA: Diagnosis not present

## 2023-08-30 DIAGNOSIS — R410 Disorientation, unspecified: Secondary | ICD-10-CM | POA: Diagnosis present

## 2023-08-30 LAB — TSH: TSH: 13.33 u[IU]/mL — ABNORMAL HIGH (ref 0.350–4.500)

## 2023-08-30 LAB — CBC WITH DIFFERENTIAL/PLATELET
Abs Immature Granulocytes: 0.03 10*3/uL (ref 0.00–0.07)
Basophils Absolute: 0 10*3/uL (ref 0.0–0.1)
Basophils Relative: 0 %
Eosinophils Absolute: 0.3 10*3/uL (ref 0.0–0.5)
Eosinophils Relative: 3 %
HCT: 48.8 % — ABNORMAL HIGH (ref 36.0–46.0)
Hemoglobin: 15.8 g/dL — ABNORMAL HIGH (ref 12.0–15.0)
Immature Granulocytes: 0 %
Lymphocytes Relative: 19 %
Lymphs Abs: 2 10*3/uL (ref 0.7–4.0)
MCH: 28.9 pg (ref 26.0–34.0)
MCHC: 32.4 g/dL (ref 30.0–36.0)
MCV: 89.4 fL (ref 80.0–100.0)
Monocytes Absolute: 0.4 10*3/uL (ref 0.1–1.0)
Monocytes Relative: 4 %
Neutro Abs: 7.5 10*3/uL (ref 1.7–7.7)
Neutrophils Relative %: 74 %
Platelets: 347 10*3/uL (ref 150–400)
RBC: 5.46 MIL/uL — ABNORMAL HIGH (ref 3.87–5.11)
RDW: 13.8 % (ref 11.5–15.5)
WBC: 10.3 10*3/uL (ref 4.0–10.5)
nRBC: 0 % (ref 0.0–0.2)

## 2023-08-30 LAB — COMPREHENSIVE METABOLIC PANEL
ALT: 21 U/L (ref 0–44)
AST: 25 U/L (ref 15–41)
Albumin: 4.4 g/dL (ref 3.5–5.0)
Alkaline Phosphatase: 66 U/L (ref 38–126)
Anion gap: 8 (ref 5–15)
BUN: 13 mg/dL (ref 6–20)
CO2: 25 mmol/L (ref 22–32)
Calcium: 8.9 mg/dL (ref 8.9–10.3)
Chloride: 103 mmol/L (ref 98–111)
Creatinine, Ser: 0.95 mg/dL (ref 0.44–1.00)
GFR, Estimated: >60 mL/min (ref >60)
Glucose, Bld: 124 mg/dL — ABNORMAL HIGH (ref 70–99)
Potassium: 3.9 mmol/L (ref 3.5–5.1)
Sodium: 136 mmol/L (ref 135–145)
Total Bilirubin: 0.6 mg/dL (ref <1.2)
Total Protein: 7.8 g/dL (ref 6.5–8.1)

## 2023-08-30 LAB — LITHIUM LEVEL: Lithium Lvl: 0.31 mmol/L — ABNORMAL LOW (ref 0.60–1.20)

## 2023-08-30 LAB — CBG MONITORING, ED: Glucose-Capillary: 139 mg/dL — ABNORMAL HIGH (ref 70–99)

## 2023-08-30 LAB — ACETAMINOPHEN LEVEL: Acetaminophen (Tylenol), Serum: <10 ug/mL — ABNORMAL LOW (ref 10–30)

## 2023-08-30 LAB — HCG, SERUM, QUALITATIVE: Preg, Serum: NEGATIVE

## 2023-08-30 LAB — SALICYLATE LEVEL: Salicylate Lvl: <7 mg/dL — ABNORMAL LOW (ref 7.0–30.0)

## 2023-08-30 LAB — ETHANOL: Alcohol, Ethyl (B): <10 mg/dL (ref <10)

## 2023-08-30 MED ORDER — LEVETIRACETAM 500 MG PO TABS: 500.0000 mg | ORAL_TABLET | Freq: Two times a day (BID) | ORAL | 2 refills | Status: DC

## 2023-08-30 MED ORDER — ZIPRASIDONE MESYLATE 20 MG IM SOLR: 20.0000 mg | Freq: Once | INTRAMUSCULAR | Status: DC

## 2023-08-30 MED ORDER — LORAZEPAM 2 MG/ML IJ SOLN
2.0000 mg | Freq: Once | INTRAMUSCULAR | Status: AC
Start: 2023-08-30 — End: 2023-08-30
  Administered 2023-08-30: 2 mg via INTRAVENOUS
  Filled 2023-08-30: qty 1

## 2023-08-30 MED ORDER — LEVETIRACETAM 500 MG PO TABS
500.0000 mg | ORAL_TABLET | Freq: Once | ORAL | Status: AC
  Administered 2023-08-30: 500 mg via ORAL
  Filled 2023-08-30: qty 1

## 2023-08-30 NOTE — ED Notes (Signed)
Pt had a seizure lasting about two minutes; ativan was ordered and given, provider at bedside.

## 2023-08-30 NOTE — Discharge Instructions (Addendum)
You were seen in the emergency department for agitation.  In the department you had a seizure.  You had lab work and a CAT scan that did not show a definite cause of your seizure.  Neurology has recommended that we refer you for an outpatient appointment with neurology and start you on medication called Keppra.  Please return to the emergency department if any worsening or concerning symptoms.  Per [facility] law you are not able to operate a motor vehicle until you are cleared by neurology.

## 2023-08-30 NOTE — ED Triage Notes (Signed)
Pt BIB EMS from home due to vomiting and lethargic appearance. EMS reports pt was on the floor of bathroom lethargy, agitated, and uncooperative. EMS administered 1 dose of Narcan due to Pt SpO2 and respiratory rate dropping suddenly. Unknown if pt has taken any substances.

## 2023-08-30 NOTE — ED Provider Notes (Signed)
[place] EMERGENCY DEPARTMENT AT [facility] Provider Note   CSN: [id number] Arrival date & time: 08/30/23  1540     History {Add pertinent medical, surgical, social history, OB history to HPI:1} No chief complaint on file.   [name] is a 27 y.o. female.  Patient was brought in by EMS from her grandfather's home.  She was reportedly driving around with some friends.  Came to her grandfather's house and laid on the floor and had possible seizure-like activity.  Has been combative since then.  Patient denies any ingestions.  Level 5 caveat secondary to altered mental status.  The history is provided by the EMS personnel and the patient.  Altered Mental Status Presenting symptoms: combativeness and confusion   Most recent episode:  Today Episode history:  Single Timing:  Constant Progression:  Unchanged      Home Medications Prior to Admission medications   Medication Sig Start Date End Date Taking? Authorizing Provider  amphetamine-dextroamphetamine (ADDERALL XR) 20 MG 24 hr capsule Take 40 mg by mouth daily. 12/14/17   [provider]  diazepam (VALIUM) 5 MG tablet Take 5-10 mg (1-2 tablets) 30 minutes before procedure 01/09/18   Porfirio Oar, PA  hydrOXYzine (ATARAX) 10 MG tablet Take 10 mg by mouth 3 (three) times daily as needed. 06/22/18   [provider]  levothyroxine (SYNTHROID, LEVOTHROID) 150 MCG tablet Take 150 mcg by mouth daily before breakfast.  04/05/16   [provider]  lithium carbonate (ESKALITH) 450 MG CR tablet Take 450 mg by mouth 2 (two) times daily.    [provider]  Melatonin 3 MG TABS Take 3 mg by mouth at bedtime.    [provider]  meloxicam (MOBIC) 15 MG tablet Take 1 tablet (15 mg total) by mouth daily. 08/13/23   Judi Saa, DO  nitroGLYCERIN (NITRO-DUR) 0.2 mg/hr patch Apply 1/4 of a patch to skin once daily. 12/12/22   Judi Saa, DO  prazosin (MINIPRESS) 2 MG capsule Take 2  mg by mouth at bedtime. Reported on 03/12/2016    [provider]  propranolol ER (INDERAL LA) 80 MG 24 hr capsule Take 80 mg by mouth daily. 08/15/22   [provider]  sertraline (ZOLOFT) 100 MG tablet Take 100 mg by mouth at bedtime. Reported on 03/12/2016    [provider]      Allergies    Alprazolam and Nickel    Review of Systems   Review of Systems  Psychiatric/Behavioral:  Positive for confusion.     Physical Exam Updated Vital Signs There were no vitals taken for this visit. Physical Exam Vitals and nursing note reviewed.  Constitutional:      General: She is not in acute distress.    Appearance: Normal appearance. She is well-developed.  HENT:     Head: Normocephalic and atraumatic.  Eyes:     Conjunctiva/sclera: Conjunctivae normal.  Cardiovascular:     Rate and Rhythm: Normal rate and regular rhythm.     Heart sounds: No murmur heard. Pulmonary:     Effort: Pulmonary effort is normal. No respiratory distress.     Breath sounds: Normal breath sounds.  Abdominal:     Palpations: Abdomen is soft.     Tenderness: There is no abdominal tenderness.  Musculoskeletal:        General: No deformity.     Cervical back: Neck supple.  Skin:    General: Skin is warm and dry.  Capillary Refill: Capillary refill takes less than 2 seconds.  Neurological:     General: No focal deficit present.     Mental Status: She is alert.     ED Results / Procedures / Treatments   Labs (all labs ordered are listed, but only abnormal results are displayed) Labs Reviewed  COMPREHENSIVE METABOLIC PANEL  CBC WITH DIFFERENTIAL/PLATELET  ETHANOL  ACETAMINOPHEN LEVEL  SALICYLATE LEVEL  URINALYSIS, ROUTINE W REFLEX MICROSCOPIC  RAPID URINE DRUG SCREEN, HOSP PERFORMED  HCG, SERUM, QUALITATIVE  LITHIUM LEVEL    EKG None  Radiology No results found.  Procedures Procedures  {Document cardiac monitor, telemetry assessment procedure when  appropriate:1}  Medications Ordered in ED Medications  ziprasidone (GEODON) injection 20 mg (has no administration in time range)    ED Course/ Medical Decision Making/ A&P   {   Click here for ABCD2, HEART and other calculatorsREFRESH Note before signing :1}                              Medical Decision Making Amount and/or Complexity of Data Reviewed Labs: ordered.  Risk Prescription drug management.   ***  {Document critical care time when appropriate:1} {Document review of labs and clinical decision tools ie heart score, Chads2Vasc2 etc:1}  {Document your independent review of radiology images, and any outside records:1} {Document your discussion with family members, caretakers, and with consultants:1} {Document social determinants of health affecting pt's care:1} {Document your decision making why or why not admission, treatments were needed:1} Final Clinical Impression(s) / ED Diagnoses Final diagnoses:  None    Rx / DC Orders ED Discharge Orders     None

## 2023-08-30 NOTE — ED Notes (Addendum)
7:11 PM  Report received from previous RN. This RN assumes care of the patient.   7:53 PM  Patient's father reports that patient was out with friends and called him stating that she didn't feel well. Patient went to the restroom, where patient's father states that she began dry heaving, became diaphoretic, and started to exhibit seizure-like activity described as twitching and fluttering of eyelids. Patient's father unable to report how long activity lasted. Hx anxiety. Patient did have another seizure while in ER, to which father reports patient exhibited same symptoms and was given Ativan. Patient is drowsy, but arousable to voice. She is oriented x 4. She has equal rise and fall of the chest wall with clear lung sounds. Pending observation. Patient currently denies any needs at this time. Call light in reach. Bed in lowest position.   8:50 PM  Patient sleeping with equal rise and fall of the chest wall. Patient in NAD. No needs at this time. Call light in reach. Bed in lowest position. Family present at bedside.   9:29 PM  Provider present at bedside.   10:19 PM  Discharge instructions discussed with patient and father and both voice understanding of discharge instructions. Patient is stable at discharge and declines any questions or concerns regarding discharge. Prescription and follow up information discussed with both patient and family, who each voice understanding. Patient has steady and equal gait. Safely assisted into car. Patient's father to transport patient home.

## 2023-09-01 ENCOUNTER — Ambulatory Visit: Payer: Medicaid Other | Admitting: Diagnostic Neuroimaging

## 2023-09-01 ENCOUNTER — Encounter: Payer: Self-pay | Admitting: Diagnostic Neuroimaging

## 2023-09-01 VITALS — BP 127/89 | HR 63 | Ht 64.0 in | Wt 137.8 lb

## 2023-09-01 DIAGNOSIS — G40909 Epilepsy, unspecified, not intractable, without status epilepticus: Secondary | ICD-10-CM

## 2023-09-01 MED ORDER — LEVETIRACETAM 500 MG PO TABS: 500.0000 mg | ORAL_TABLET | Freq: Two times a day (BID) | ORAL | 4 refills | Status: DC

## 2023-09-01 NOTE — Progress Notes (Signed)
[facility]  PATIENT: [name] DOB: 12/23/1995  REFERRING CLINICIAN: Terrilee Files, MD HISTORY FROM: patient  REASON FOR VISIT: new consult   HISTORICAL  CHIEF COMPLAINT:  Chief Complaint  Patient presents with   New Patient (Initial Visit)    Patient in room #6 with her mother and father. Patient states she here to discuss her history of seizure. Patient mother states patient has eaten in a couple days and been sleeping all day.    HISTORY OF PRESENT ILLNESS:   27 year old female here for evaluation of seizures.  Over the past year patient has had intermittent episodes of out of body sensation, generalized pins and needle sensation, sweaty sensation.  History of depression, anxiety, PTSD, ADD, on medications and managed by behavioral health.  08/30/2023 patient woke up feeling normal.  She went out with a friend and then started to not feel well.  She came back home had nausea and vomiting.  Her eyes started fluttering and patient lost consciousness.  She went limp and pale.  She had decreased breathing.  She was sweaty.  EMS was called to the house.  She was taken to the hospital.  She had a second event which was suspicious for seizure.  She was treated with Ativan in the emergency room.  At some point patient was noted to have some tongue bite injury.  Patient was stabilized and discharged on levetiracetam 500 mg twice a day.  No known family history of seizure disorder (patient was adopted at age 7 months old).  No prior history of meningitis, encephalitis or major head trauma.  Since then, patient has ongoing inability to keep down food or liquids (because nausea is triggered).    REVIEW OF SYSTEMS: Full 14 system review of systems performed and negative with exception of: as per HPI.  ALLERGIES: Allergies  Allergen Reactions   Alprazolam Other (See Comments)    Makes er very angry and hostile   Nickel Rash    HOME  MEDICATIONS: Outpatient Medications Prior to Visit  Medication Sig Dispense Refill   levothyroxine (SYNTHROID, LEVOTHROID) 150 MCG tablet Take 150 mcg by mouth daily before breakfast.      lithium carbonate (ESKALITH) 450 MG CR tablet Take 450 mg by mouth 2 (two) times daily.     propranolol ER (INDERAL LA) 80 MG 24 hr capsule Take 80 mg by mouth daily.     sertraline (ZOLOFT) 100 MG tablet Take 100 mg by mouth at bedtime. Reported on 03/12/2016     levETIRAcetam (KEPPRA) 500 MG tablet Take 1 tablet (500 mg total) by mouth 2 (two) times daily. 60 tablet 2   meloxicam (MOBIC) 15 MG tablet Take 1 tablet (15 mg total) by mouth daily. (Patient not taking: Reported on 09/01/2023) 30 tablet 0   amphetamine-dextroamphetamine (ADDERALL XR) 20 MG 24 hr capsule Take 40 mg by mouth daily. (Patient not taking: Reported on 09/01/2023)     diazepam (VALIUM) 5 MG tablet Take 5-10 mg (1-2 tablets) 30 minutes before procedure (Patient not taking: Reported on 09/01/2023) 2 tablet 0   hydrOXYzine (ATARAX) 10 MG tablet Take 10 mg by mouth 3 (three) times daily as needed. (Patient not taking: Reported on 09/01/2023)     Melatonin 3 MG TABS Take 3 mg by mouth at bedtime. (Patient not taking: Reported on 09/01/2023)     nitroGLYCERIN (NITRO-DUR) 0.2 mg/hr patch Apply 1/4 of a patch to skin once daily. (Patient not taking: Reported on 09/01/2023) 30 patch 0  prazosin (MINIPRESS) 2 MG capsule Take 2 mg by mouth at bedtime. Reported on 03/12/2016 (Patient not taking: Reported on 09/01/2023)     No facility-administered medications prior to visit.    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    FAMILY HISTORY: Family History  Adopted: Yes  Family history unknown: Yes    SOCIAL HISTORY: Social History   Socioeconomic History   Marital status: Single    Spouse name: n/a   Number of children: 0   Years of education: Not on file   Highest education level: Not on file  Occupational History   Occupation: unemployed  Tobacco Use   Smoking status: Every Day    Current packs/day: 1.00    Average packs/day: 1 pack/day for 1 year (1.0 ttl pk-yrs)    Types: Cigarettes   Smokeless tobacco: Never  Vaping Use   Vaping status: Former  Substance and Sexual Activity   Alcohol use: No    Alcohol/week: 0.0 standard drinks of alcohol   Drug use: Yes    Types: Marijuana   Sexual activity: Yes    Comment: In a homosexual relationship  Other Topics Concern   Not on file  Social History Narrative   Lives with adoptive mom, dad and brother. Both [name] and her brother, [name], were adopted from [place]. [name] was adopted at age 61 months. She would like to find her birth mother when she turns 29. Finished high school in 2015.   Took some classes at [employer], looking for work. Meets with a Psychiatric nurse.   Social Determinants of Health   Financial Resource Strain: Low Risk  (03/07/2023)   Received from [facility]   Overall Financial Resource Strain (CARDIA)    Difficulty of Paying Living Expenses: Not hard at all  Food Insecurity: No Food Insecurity (03/07/2023)   Received from [facility]   Hunger Vital Sign    Worried About Running Out of Food in the Last Year: Never true    Ran Out of Food in the Last Year: Never true  Transportation Needs: No Transportation Needs (03/07/2023)   Received from [facility] - Transportation    Lack of Transportation (Medical): No    Lack of Transportation  (Non-Medical): No  Physical Activity: Sufficiently Active (03/07/2023)   Received from [facility]   Exercise Vital Sign    Days of Exercise per Week: 7 days    Minutes of Exercise per Session: 30 min  Stress: No Stress Concern Present (03/07/2023)   Received from [facility] - Occupational Stress Questionnaire    Feeling of Stress : Not at all  Social Connections: Socially Isolated (03/07/2023)   Received from [facility]   Social Connection and Isolation Panel [NHANES]    Frequency of Communication with Friends and Family: More than three times a week    Frequency  of Social Gatherings with Friends and Family: More than three times a week    Attends Religious Services: Never    Database administrator or Organizations: No    Attends Banker Meetings: Never    Marital Status: Never married  Intimate Partner Violence: Not At Risk (03/07/2023)   Received from [facility], Afraid, Rape, and Kick questionnaire    Fear of Current or Ex-Partner: No    Emotionally Abused: No    Physically Abused: No    Sexually Abused: No     PHYSICAL EXAM  GENERAL EXAM/CONSTITUTIONAL: Vitals:  Vitals:   09/01/23 1100  BP: 127/89  Pulse: 63  Weight: 137 lb 12.8 oz (62.5 kg)  Height: 5\' 4"  (1.626 m)   Body mass index is 23.65 kg/m. Wt Readings from Last 3 Encounters:  09/01/23 137 lb 12.8 oz (62.5 kg)  08/13/23 140 lb (63.5 kg)  12/08/22 135 lb (61.2 kg)   Patient is in no distress; well developed, nourished and groomed; neck is supple  CARDIOVASCULAR: Examination of carotid arteries is normal; no carotid bruits Regular rate and rhythm, no murmurs Examination of peripheral vascular system by observation and palpation is normal  EYES: Ophthalmoscopic exam of optic discs and posterior segments is normal; no papilledema or hemorrhages No results found.  MUSCULOSKELETAL: Gait, strength, tone, movements noted in  Neurologic exam below  NEUROLOGIC: MENTAL STATUS:      No data to display         awake, alert, oriented to person, place and time recent and remote memory intact normal attention and concentration language fluent, comprehension intact, naming intact fund of knowledge appropriate  CRANIAL NERVE:  2nd - no papilledema on fundoscopic exam 2nd, 3rd, 4th, 6th - pupils equal and reactive to light, visual fields full to confrontation, extraocular muscles intact, no nystagmus 5th - facial sensation symmetric 7th - facial strength symmetric 8th - hearing intact 9th - palate elevates symmetrically, uvula midline 11th - shoulder shrug symmetric 12th - tongue protrusion midline  MOTOR:  normal bulk and tone, full strength in the BUE, BLE  SENSORY:  normal and symmetric to light touch, temperature, vibration  COORDINATION:  finger-nose-finger, fine finger movements normal  REFLEXES:  deep tendon reflexes present and symmetric  GAIT/STATION:  narrow based gait    DIAGNOSTIC DATA (LABS, IMAGING, TESTING) - I reviewed patient records, labs, notes, testing and imaging myself where available.  Lab Results  Component Value Date   WBC 10.3 08/30/2023   HGB 15.8 (H) 08/30/2023   HCT 48.8 (H) 08/30/2023   MCV 89.4 08/30/2023   PLT 347 08/30/2023      Component Value Date/Time   NA 136 08/30/2023 1638   K 3.9 08/30/2023 1638   CL 103 08/30/2023 1638   CO2 25 08/30/2023 1638   GLUCOSE 124 (H) 08/30/2023 1638   BUN 13 08/30/2023 1638   CREATININE 0.95 08/30/2023 1638   CREATININE 0.81 06/06/2015 0906   CALCIUM 8.9 08/30/2023 1638   PROT 7.8 08/30/2023 1638   ALBUMIN 4.4 08/30/2023 1638   AST 25 08/30/2023 1638   ALT 21 08/30/2023 1638   ALKPHOS 66 08/30/2023 1638   BILITOT 0.6 08/30/2023 1638   GFRNONAA >60 08/30/2023 1638   GFRAA >60 06/25/2016 1508   Lab Results  Component Value Date   CHOL 201 (H) 10/22/2013   HDL 54 10/22/2013   LDLCALC 107 10/22/2013   TRIG  202 (H) 10/22/2013   CHOLHDL 3.7 10/22/2013  Lab Results  Component Value Date   HGBA1C 5.8 (H) 10/22/2013   No results found for: "VITAMINB12" Lab Results  Component Value Date   TSH 13.330 (H) 08/30/2023    08/30/23 CT head - No acute intracranial abnormality.    ASSESSMENT AND PLAN  27 y.o. year old female here with:  Dx:  1. Seizure disorder (HCC)     PLAN:  NEW ONSET SEIZURE (08/30/23; 2 events)  - check MRI brain, EEG  - levetiracetam 500mg  twice a day (may consider lamotrigine, divalproex in future)  - follow up with PCP re: appetite / nausea and to for syncope / cardiology workup  - According to [place] law, you can not drive unless you are seizure / syncope free for at least 6 months and under physician's care.   - Please maintain precautions. Do not participate in activities where a loss of awareness could harm you or someone else. No swimming alone, no tub bathing, no hot tubs, no driving, no operating motorized vehicles (cars, ATVs, motocycles, etc), lawnmowers, power tools or firearms. No standing at heights, such as rooftops, ladders or stairs. Avoid hot objects such as stoves, heaters, open fires. Wear a helmet when riding a bicycle, scooter, skateboard, etc. and avoid areas of traffic. Set your water heater to 120 degrees or less.   Orders Placed This Encounter  Procedures   MR BRAIN W WO CONTRAST   EEG adult   Meds ordered this encounter  Medications   levETIRAcetam (KEPPRA) 500 MG tablet    Sig: Take 1 tablet (500 mg total) by mouth 2 (two) times daily.    Dispense:  180 tablet    Refill:  4   Return in about 4 months (around 12/31/2023) for MyChart visit (15 min).  I reviewed images, labs, notes, records myself. I summarized findings and reviewed with patient, for this high risk condition (seizure disorder) requiring high complexity decision making.   Suanne Marker, MD 09/01/2023, 12:00 PM Certified in Neurology, Neurophysiology and  Neuroimaging  [facility] [address] [place], [place] [address] [phone]

## 2023-09-01 NOTE — Patient Instructions (Signed)
  NEW ONSET SEIZURE (08/30/23)  - check MRI brain, EEG  - levetiracetam 500mg  twice a day (may consider lamotrigine, divalproex in future)  - According to [place] law, you can not drive unless you are seizure / syncope free for at least 6 months and under physician's care.   - Please maintain precautions. Do not participate in activities where a loss of awareness could harm you or someone else. No swimming alone, no tub bathing, no hot tubs, no driving, no operating motorized vehicles (cars, ATVs, motocycles, etc), lawnmowers, power tools or firearms. No standing at heights, such as rooftops, ladders or stairs. Avoid hot objects such as stoves, heaters, open fires. Wear a helmet when riding a bicycle, scooter, skateboard, etc. and avoid areas of traffic. Set your water heater to 120 degrees or less.

## 2023-09-05 ENCOUNTER — Telehealth: Payer: Self-pay | Admitting: Diagnostic Neuroimaging

## 2023-09-05 NOTE — Telephone Encounter (Signed)
Pt's mother,Margaret Yvone Neu, requesting medication, Valium for MRI on January 5 at [facility]. Please send to  [facility] [phone]

## 2023-09-09 ENCOUNTER — Telehealth: Payer: Self-pay | Admitting: Diagnostic Neuroimaging

## 2023-09-09 ENCOUNTER — Telehealth: Payer: Self-pay

## 2023-09-09 NOTE — Telephone Encounter (Signed)
Received a clearance for dental treatment in regards to seizures. Paper copy in pod Does patient need prophylactic antibiotics? If so, what type? Does patient require an interruption in anticoagulation? Are there any anesthetic restrictions? What type of pain med do you recommend?

## 2023-09-09 NOTE — Telephone Encounter (Signed)
Form signed by MD, placed in medical records for pick up/

## 2023-09-09 NOTE — Telephone Encounter (Signed)
-   Ok to proceed with dental procedure. Continue levetiracetam.  - avoid tramadol. - other questions deferred to PCP.  Suanne Marker, MD 09/09/2023, 3:54 PM Certified in Neurology, Neurophysiology and Neuroimaging  [facility] [address] [place], [place] [address] [phone]

## 2023-09-09 NOTE — Telephone Encounter (Signed)
Spoke to mother ( checked DPR) Mother wanted to make sure of daughters driving privileges Per Dr Marjory Lies last office note on 09/01/2023   - According to [facility] law, you can not drive unless you are seizure / syncope free for at least 6 months and under physician's care.    - Please maintain precautions. Do not participate in activities where a loss of awareness could harm you or someone else. No swimming alone, no tub bathing, no hot tubs, no driving, no operating motorized vehicles (cars, ATVs, motocycles, etc), lawnmowers, power tools or firearms. No standing at heights, such as rooftops, ladders or stairs. Avoid hot objects such as stoves, heaters, open fires. Wear a helmet when riding a bicycle, scooter, skateboard, etc. and avoid areas of traffic. Set your water heater to 120 degrees or less.  Mother states pt has a lot of push back about driving. Informed mother all that I read back to her is on patients After visit summery  which is on MyChart . I informed pt mother she can also call DMV to speak to pt . Mother was very appreciative for information and will relay all the information I gave her today to pt

## 2023-09-09 NOTE — Telephone Encounter (Signed)
Pt's mother called wanted to know if Dr. Marjory Lies had sent anything to [facility] about patient not being able to drive for 6 months due having a seizure and does DMV send patient anything regarding the restriction.  I Pattricia Boss) discuss the nurse, nurse decided to speak with patient's mother to clarify.

## 2023-09-09 NOTE — Telephone Encounter (Signed)
Placed form in provider office for review and signature

## 2023-09-16 ENCOUNTER — Encounter: Payer: Self-pay | Admitting: Diagnostic Neuroimaging

## 2023-09-22 MED ORDER — ALPRAZOLAM 0.5 MG PO TABS: ORAL_TABLET | ORAL | 0 refills | Status: DC

## 2023-09-22 NOTE — Telephone Encounter (Signed)
Meds ordered this encounter  Medications   ALPRAZolam (XANAX) 0.5 MG tablet    Sig: for sedation before MRI scan; take 1 tab 1 hour before scan; may repeat 1 tab 15 min before scan    Dispense:  3 tablet    Refill:  0   Suanne Marker, MD 09/22/2023, 1:22 PM Certified in Neurology, Neurophysiology and Neuroimaging  [facility] [address], [address] [place], [place] [address] [phone]

## 2023-09-30 ENCOUNTER — Encounter: Payer: Self-pay | Admitting: Diagnostic Neuroimaging

## 2023-10-05 ENCOUNTER — Ambulatory Visit
Admission: RE | Admit: 2023-10-05 | Discharge: 2023-10-05 | Disposition: A | Discharge status: Home / Self Care | Payer: Medicaid Other | Source: Ambulatory Visit | Attending: Diagnostic Neuroimaging | Admitting: Diagnostic Neuroimaging

## 2023-10-05 DIAGNOSIS — G40909 Epilepsy, unspecified, not intractable, without status epilepticus: Secondary | ICD-10-CM

## 2023-10-05 MED ORDER — GADOPICLENOL 0.5 MMOL/ML IV SOLN
6.0000 mL | Freq: Once | INTRAVENOUS | Status: AC | PRN
Start: 2023-10-05 — End: 2023-10-05
  Administered 2023-10-05: 6 mL via INTRAVENOUS

## 2023-10-09 ENCOUNTER — Ambulatory Visit (INDEPENDENT_AMBULATORY_CARE_PROVIDER_SITE_OTHER): Payer: Medicaid Other | Admitting: Diagnostic Neuroimaging

## 2023-10-09 DIAGNOSIS — G40909 Epilepsy, unspecified, not intractable, without status epilepticus: Secondary | ICD-10-CM

## 2023-10-15 ENCOUNTER — Ambulatory Visit: Payer: Medicaid Other | Admitting: Family Medicine

## 2023-10-21 NOTE — Procedures (Signed)
   [facility]  EEG (ELECTROENCEPHALOGRAM) REPORT   STUDY DATE: 10/09/23 PATIENT NAME: [name] DOB: [birth year] MRN: [id number]  ORDERING CLINICIAN: Joycelyn Schmid, MD   TECHNOLOGIST: Marcheta Grammes TECHNIQUE: Electroencephalogram was recorded utilizing standard 10-20 system of lead placement and reformatted into average and bipolar montages.  RECORDING TIME: 27 minutes ACTIVATION: hyperventilation and photic stimulation  CLINICAL INFORMATION: 28 year old female with seizures.  FINDINGS: Posterior dominant background rhythms, which attenuate with eye opening, ranging 9-10 hertz and 15-20 microvolts. No focal, lateralizing, epileptiform activity or seizures are seen. Patient recorded in the awake and drowsy state. EKG channel shows regular rhythm of 40-45 beats per minute.   IMPRESSION:   Normal EEG in the awake and drowsy states. Of note EKG channel demonstrates sinus bradycardia (40-45 beats per minute).   INTERPRETING PHYSICIAN:  Suanne Marker, MD Certified in Neurology, Neurophysiology and Neuroimaging  [facility] [address], [address] [place], [place] [address] [phone]

## 2023-10-24 ENCOUNTER — Telehealth: Payer: Self-pay | Admitting: *Deleted

## 2023-10-24 NOTE — Telephone Encounter (Signed)
LVM pt to call back to discuss  EEG results

## 2023-10-24 NOTE — Telephone Encounter (Signed)
Spoke with mother (checked DPR) gave Pt  EEG results and Dr Marjory Lies recommendations .Mother states will call PCP today due to low heart rate noted during EEG Mother thanked me for calling

## 2023-10-24 NOTE — Telephone Encounter (Signed)
-----   Message from Suanne Marker sent at 10/24/2023 10:12 AM EST ----- EEG is good, no major findings. Low heart rate noted, and recommend to follow up with PCP or cardiology. -VRP

## 2023-10-24 NOTE — Progress Notes (Signed)
EEG is good, no major findings. Low heart rate noted, and recommend to follow up with PCP or cardiology. -VRP

## 2023-12-30 ENCOUNTER — Encounter: Payer: Self-pay | Admitting: Diagnostic Neuroimaging

## 2023-12-30 ENCOUNTER — Telehealth (INDEPENDENT_AMBULATORY_CARE_PROVIDER_SITE_OTHER): Payer: Medicaid Other | Admitting: Diagnostic Neuroimaging

## 2023-12-30 DIAGNOSIS — G40909 Epilepsy, unspecified, not intractable, without status epilepticus: Secondary | ICD-10-CM | POA: Diagnosis not present

## 2023-12-30 MED ORDER — LEVETIRACETAM 500 MG PO TABS: 500.0000 mg | ORAL_TABLET | Freq: Two times a day (BID) | ORAL | 4 refills | Status: AC

## 2023-12-30 NOTE — Progress Notes (Signed)
 [facility]  PATIENT: [name] DOB: 25-May-1996  REFERRING CLINICIAN: Porfirio Oar, PA HISTORY FROM: patient  REASON FOR VISIT: follow up   HISTORICAL  CHIEF COMPLAINT:  Chief Complaint  Patient presents with   seizure disorder    HISTORY OF PRESENT ILLNESS:   UPDATE (12/30/23, VRP): Since last visit, doing well. Tolerating LEV 500mg  twice a day. No more spells or seizures.   PRIOR HPI (09/01/23, VRP): 28 year old female here for evaluation of seizures.  Over the past year patient has had intermittent episodes of out of body sensation, generalized pins and needle sensation, sweaty sensation.  History of depression, anxiety, PTSD, ADD, on medications and managed by behavioral health.  08/30/2023 patient woke up feeling normal.  She went out with a friend and then started to not feel well.  She came back home had nausea and vomiting.  Her eyes started fluttering and patient lost consciousness.  She went limp and pale.  She had decreased breathing.  She was sweaty.  EMS was called to the house.  She was taken to the hospital.  She had a second event which was suspicious for seizure.  She was treated with Ativan in the emergency room.  At some point patient was noted to have some tongue bite injury.  Patient was stabilized and discharged on levetiracetam 500 mg twice a day.  No known family history of seizure disorder (patient was adopted at age 57 months old).  No prior history of meningitis, encephalitis or major head trauma.  Since then, patient has ongoing inability to keep down food or liquids (because nausea is triggered).    REVIEW OF SYSTEMS: Full 14 system review of systems performed and negative with exception of: as per HPI.  ALLERGIES: Allergies  Allergen Reactions   Alprazolam Other (See Comments)    Makes er very angry and hostile   Nickel Rash    HOME MEDICATIONS: Outpatient Medications Prior to Visit  Medication Sig Dispense Refill    levothyroxine (SYNTHROID, LEVOTHROID) 150 MCG tablet Take 150 mcg by mouth daily before breakfast.      lithium carbonate (ESKALITH) 450 MG CR tablet Take 450 mg by mouth 2 (two) times daily.     sertraline (ZOLOFT) 100 MG tablet Take 100 mg by mouth at bedtime. Reported on 03/12/2016     ALPRAZolam (XANAX) 0.5 MG tablet for sedation before MRI scan; take 1 tab 1 hour before scan; may repeat 1 tab 15 min before scan 3 tablet 0   levETIRAcetam (KEPPRA) 500 MG tablet Take 1 tablet (500 mg total) by mouth 2 (two) times daily. 180 tablet 4   meloxicam (MOBIC) 15 MG tablet Take 1 tablet (15 mg total) by mouth daily. (Patient not taking: Reported on 09/01/2023) 30 tablet 0   propranolol ER (INDERAL LA) 80 MG 24 hr capsule Take 80 mg by mouth daily.     No facility-administered medications prior to visit.    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    FAMILY HISTORY: Family History  Adopted: Yes  Family history unknown: Yes    SOCIAL HISTORY: Social History   Socioeconomic History   Marital status: Single    Spouse name: n/a   Number of children: 0   Years of education: Not on file   Highest education level: Not on file  Occupational History   Occupation: unemployed  Tobacco Use   Smoking status: Every Day    Current packs/day: 1.00    Average packs/day: 1 pack/day for 1 year (1.0 ttl pk-yrs)    Types: Cigarettes   Smokeless tobacco: Never  Vaping Use   Vaping status: Former  Substance and Sexual  Activity   Alcohol use: No    Alcohol/week: 0.0 standard drinks of alcohol   Drug use: Yes    Types: Marijuana   Sexual activity: Yes    Comment: In a homosexual relationship  Other Topics Concern   Not on file  Social History Narrative   Lives with adoptive mom, dad and brother. Both Deriyah and her brother, Jerilynn Som, were adopted from [place]. Raylyn was adopted at age 36 months. She would like to find her birth mother when she turns 29. Finished high school in 2015.   Took some classes at [employer], looking for work. Meets with a Psychiatric nurse.   Social Drivers of Corporate investment banker Strain: Low Risk  (10/27/2023)   Received from [facility]   Overall Financial Resource Strain (CARDIA)    Difficulty of Paying Living Expenses: Not hard at all  Food Insecurity: No Food Insecurity (10/27/2023)   Received from [facility]   Hunger Vital Sign    Worried About Running Out of Food in the Last Year: Never true    Ran Out of Food in the Last Year: Never true  Transportation Needs: No Transportation Needs (10/27/2023)   Received from [facility] - Transportation    Lack of Transportation (Medical): No    Lack of Transportation (Non-Medical): No  Physical Activity: Sufficiently Active (03/07/2023)   Received from [facility]   Exercise Vital Sign    Days of Exercise per Week: 7 days    Minutes of Exercise per Session: 30 min  Stress: No Stress Concern Present (03/07/2023)   Received from [facility] - Occupational Stress Questionnaire    Feeling of Stress : Not at all  Social Connections: Socially Isolated (03/07/2023)   Received from [facility]   Social Connection and Isolation Panel [NHANES]    Frequency of Communication with Friends and Family: More than three times a week    Frequency of Social Gatherings with Friends and Family: More than three times a week    Attends Religious Services: Never     Database administrator or Organizations: No    Attends Banker Meetings: Never    Marital Status: Never married  Intimate Partner Violence: Not At Risk (03/07/2023)   Received from [facility], Afraid, Rape, and Kick questionnaire    Fear of Current or Ex-Partner: No    Emotionally Abused: No    Physically Abused: No    Sexually Abused: No     PHYSICAL EXAM  GENERAL EXAM/CONSTITUTIONAL: Vitals:  There were no vitals filed for this visit.  There is no height or weight on file to calculate BMI. Wt Readings from Last  3 Encounters:  09/01/23 137 lb 12.8 oz (62.5 kg)  08/13/23 140 lb (63.5 kg)  12/08/22 135 lb (61.2 kg)   Patient is in no distress; well developed, nourished and groomed; neck is supple  CARDIOVASCULAR: Examination of carotid arteries is normal; no carotid bruits Regular rate and rhythm, no murmurs Examination of peripheral vascular system by observation and palpation is normal  EYES: Ophthalmoscopic exam of optic discs and posterior segments is normal; no papilledema or hemorrhages No results found.  MUSCULOSKELETAL: Gait, strength, tone, movements noted in Neurologic exam below  NEUROLOGIC: MENTAL STATUS:      No data to display         awake, alert, oriented to person, place and time recent and remote memory intact normal attention and concentration language fluent, comprehension intact, naming intact fund of knowledge appropriate  CRANIAL NERVE:  2nd - no papilledema on fundoscopic exam 2nd, 3rd, 4th, 6th - pupils equal and reactive to light, visual fields full to confrontation, extraocular muscles intact, no nystagmus 5th - facial sensation symmetric 7th - facial strength symmetric 8th - hearing intact 9th - palate elevates symmetrically, uvula midline 11th - shoulder shrug symmetric 12th - tongue protrusion midline  MOTOR:  normal bulk and tone, full strength in the BUE, BLE  SENSORY:  normal and symmetric  to light touch, temperature, vibration  COORDINATION:  finger-nose-finger, fine finger movements normal  REFLEXES:  deep tendon reflexes present and symmetric  GAIT/STATION:  narrow based gait    DIAGNOSTIC DATA (LABS, IMAGING, TESTING) - I reviewed patient records, labs, notes, testing and imaging myself where available.  Lab Results  Component Value Date   WBC 10.3 08/30/2023   HGB 15.8 (H) 08/30/2023   HCT 48.8 (H) 08/30/2023   MCV 89.4 08/30/2023   PLT 347 08/30/2023      Component Value Date/Time   NA 136 08/30/2023 1638   K 3.9 08/30/2023 1638   CL 103 08/30/2023 1638   CO2 25 08/30/2023 1638   GLUCOSE 124 (H) 08/30/2023 1638   BUN 13 08/30/2023 1638   CREATININE 0.95 08/30/2023 1638   CREATININE 0.81 06/06/2015 0906   CALCIUM 8.9 08/30/2023 1638   PROT 7.8 08/30/2023 1638   ALBUMIN 4.4 08/30/2023 1638   AST 25 08/30/2023 1638   ALT 21 08/30/2023 1638   ALKPHOS 66 08/30/2023 1638   BILITOT 0.6 08/30/2023 1638   GFRNONAA >60 08/30/2023 1638   GFRAA >60 06/25/2016 1508   Lab Results  Component Value Date   CHOL 201 (H) 10/22/2013   HDL 54 10/22/2013   LDLCALC 107 10/22/2013   TRIG 202 (H) 10/22/2013   CHOLHDL 3.7 10/22/2013   Lab Results  Component Value Date   HGBA1C 5.8 (H) 10/22/2013   No results found for: "VITAMINB12" Lab Results  Component Value Date   TSH 13.330 (H) 08/30/2023    08/30/23 CT head - No acute intracranial abnormality.    ASSESSMENT AND PLAN  28 y.o. year old female here with:  Dx:  1. Seizure disorder (HCC)      PLAN:  NEW ONSET SEIZURE (08/30/23; 2 events)  - continue levetiracetam 500mg  twice a day (may consider lamotrigine, divalproex in future)  - follow up with PCP re: appetite / nausea and to for syncope / cardiology workup  - According to [place] law, you can not drive unless you are seizure / syncope free for at least 6 months and under physician's care.   - Please maintain precautions. Do not  participate  in activities where a loss of awareness could harm you or someone else. No swimming alone, no tub bathing, no hot tubs, no driving, no operating motorized vehicles (cars, ATVs, motocycles, etc), lawnmowers, power tools or firearms. No standing at heights, such as rooftops, ladders or stairs. Avoid hot objects such as stoves, heaters, open fires. Wear a helmet when riding a bicycle, scooter, skateboard, etc. and avoid areas of traffic. Set your water heater to 120 degrees or less.   No orders of the defined types were placed in this encounter.  Meds ordered this encounter  Medications   levETIRAcetam (KEPPRA) 500 MG tablet    Sig: Take 1 tablet (500 mg total) by mouth 2 (two) times daily.    Dispense:  180 tablet    Refill:  4   Return in about 1 year (around 12/29/2024) for MyChart visit (15 min).  Virtual Visit via Video Note  I connected with Netta Cedars on 12/30/23 at  2:15 PM EDT by a video enabled telemedicine application and verified that I am speaking with the correct person using two identifiers.   I discussed the limitations of evaluation and management by telemedicine and the availability of in person appointments. The patient expressed understanding and agreed to proceed.  Patient is at home and I am at the office.   I spent 15 minutes of face-to-face and non-face-to-face time with patient.  This included previsit chart review, lab review, study review, order entry, electronic health record documentation, patient education.       Suanne Marker, MD 12/30/2023, 2:18 PM Certified in Neurology, Neurophysiology and Neuroimaging  [facility] [address], [address] [place], [place] [address] [phone]

## 2024-01-12 ENCOUNTER — Telehealth: Payer: Medicaid Other | Admitting: Diagnostic Neuroimaging

## 2024-06-21 ENCOUNTER — Ambulatory Visit: Admitting: Family Medicine
